# Patient Record
Sex: Female | Born: 1965 | Hispanic: Yes | Marital: Married | Smoking: Never smoker
Health system: Southern US, Community
[De-identification: ages and names within clinical notes are randomized; demographics above are authoritative.]

## PROBLEM LIST (undated history)

## (undated) DIAGNOSIS — M659 Unspecified synovitis and tenosynovitis, unspecified site: Secondary | ICD-10-CM

## (undated) DIAGNOSIS — R1013 Epigastric pain: Secondary | ICD-10-CM

## (undated) DIAGNOSIS — M064 Inflammatory polyarthropathy: Secondary | ICD-10-CM

## (undated) DIAGNOSIS — C439 Malignant melanoma of skin, unspecified: Secondary | ICD-10-CM

## (undated) DIAGNOSIS — R7689 Other specified abnormal immunological findings in serum: Secondary | ICD-10-CM

## (undated) DIAGNOSIS — K219 Gastro-esophageal reflux disease without esophagitis: Secondary | ICD-10-CM

## (undated) DIAGNOSIS — R768 Other specified abnormal immunological findings in serum: Secondary | ICD-10-CM

## (undated) DIAGNOSIS — E785 Hyperlipidemia, unspecified: Secondary | ICD-10-CM

## (undated) DIAGNOSIS — R7982 Elevated C-reactive protein (CRP): Secondary | ICD-10-CM

## (undated) DIAGNOSIS — K2270 Barrett's esophagus without dysplasia: Secondary | ICD-10-CM

## (undated) DIAGNOSIS — M19041 Primary osteoarthritis, right hand: Secondary | ICD-10-CM

## (undated) DIAGNOSIS — K209 Esophagitis, unspecified without bleeding: Secondary | ICD-10-CM

## (undated) DIAGNOSIS — M1990 Unspecified osteoarthritis, unspecified site: Secondary | ICD-10-CM

## (undated) DIAGNOSIS — K449 Diaphragmatic hernia without obstruction or gangrene: Secondary | ICD-10-CM

## (undated) DIAGNOSIS — D649 Anemia, unspecified: Secondary | ICD-10-CM

## (undated) DIAGNOSIS — N809 Endometriosis, unspecified: Secondary | ICD-10-CM

## (undated) DIAGNOSIS — Z8619 Personal history of other infectious and parasitic diseases: Secondary | ICD-10-CM

## (undated) DIAGNOSIS — N8003 Adenomyosis of the uterus: Secondary | ICD-10-CM

## (undated) DIAGNOSIS — N800 Endometriosis of uterus: Secondary | ICD-10-CM

## (undated) DIAGNOSIS — J309 Allergic rhinitis, unspecified: Secondary | ICD-10-CM

## (undated) DIAGNOSIS — D219 Benign neoplasm of connective and other soft tissue, unspecified: Secondary | ICD-10-CM

## (undated) DIAGNOSIS — D271 Benign neoplasm of left ovary: Secondary | ICD-10-CM

## (undated) HISTORY — PX: CERVICAL POLYPECTOMY: SHX88

## (undated) HISTORY — PX: ABLATION: SHX5711

## (undated) HISTORY — PX: TUBAL LIGATION: SHX77

## (undated) HISTORY — PX: TONSILLECTOMY: SUR1361

## (undated) HISTORY — PX: BILATERAL SALPINGOOPHORECTOMY: SHX1223

## (undated) HISTORY — PX: ABDOMINAL HYSTERECTOMY: SHX81

## (undated) HISTORY — PX: ESOPHAGOGASTRODUODENOSCOPY: SHX1529

## (undated) HISTORY — PX: MELANOMA EXCISION: SHX5266

## (undated) HISTORY — PX: COLONOSCOPY: SHX174

---

## 1997-08-14 DIAGNOSIS — C439 Malignant melanoma of skin, unspecified: Secondary | ICD-10-CM

## 1997-08-14 HISTORY — DX: Malignant melanoma of skin, unspecified: C43.9

## 2005-05-29 ENCOUNTER — Ambulatory Visit: Payer: Self-pay | Admitting: Obstetrics and Gynecology

## 2006-07-03 ENCOUNTER — Ambulatory Visit: Payer: Self-pay | Admitting: Obstetrics and Gynecology

## 2007-07-10 ENCOUNTER — Ambulatory Visit: Payer: Self-pay | Admitting: Obstetrics and Gynecology

## 2008-05-14 ENCOUNTER — Ambulatory Visit: Payer: Self-pay | Admitting: Internal Medicine

## 2008-06-14 ENCOUNTER — Ambulatory Visit: Payer: Self-pay | Admitting: Internal Medicine

## 2008-06-19 ENCOUNTER — Ambulatory Visit: Payer: Self-pay | Admitting: Internal Medicine

## 2008-07-14 ENCOUNTER — Ambulatory Visit: Payer: Self-pay | Admitting: Internal Medicine

## 2008-07-17 ENCOUNTER — Ambulatory Visit: Payer: Self-pay | Admitting: Obstetrics and Gynecology

## 2009-06-16 ENCOUNTER — Ambulatory Visit: Payer: Self-pay | Admitting: Obstetrics and Gynecology

## 2009-06-25 ENCOUNTER — Ambulatory Visit: Payer: Self-pay | Admitting: Obstetrics and Gynecology

## 2009-07-20 ENCOUNTER — Ambulatory Visit: Payer: Self-pay | Admitting: Obstetrics and Gynecology

## 2010-07-21 ENCOUNTER — Ambulatory Visit: Payer: Self-pay | Admitting: Obstetrics and Gynecology

## 2011-01-15 ENCOUNTER — Emergency Department: Payer: Self-pay | Admitting: Emergency Medicine

## 2011-02-08 ENCOUNTER — Ambulatory Visit: Payer: Self-pay | Admitting: Internal Medicine

## 2011-02-09 ENCOUNTER — Ambulatory Visit: Payer: Self-pay | Admitting: Obstetrics and Gynecology

## 2011-03-07 ENCOUNTER — Ambulatory Visit: Payer: Self-pay | Admitting: Gastroenterology

## 2011-03-09 LAB — PATHOLOGY REPORT

## 2011-04-03 ENCOUNTER — Ambulatory Visit: Payer: Self-pay | Admitting: Obstetrics and Gynecology

## 2011-04-10 ENCOUNTER — Inpatient Hospital Stay: Payer: Self-pay | Admitting: Obstetrics and Gynecology

## 2011-09-19 ENCOUNTER — Ambulatory Visit: Payer: Self-pay | Admitting: Obstetrics and Gynecology

## 2012-07-02 ENCOUNTER — Ambulatory Visit: Payer: Self-pay | Admitting: Gastroenterology

## 2012-07-03 LAB — PATHOLOGY REPORT

## 2012-09-19 ENCOUNTER — Ambulatory Visit: Payer: Self-pay | Admitting: Obstetrics and Gynecology

## 2013-06-16 ENCOUNTER — Ambulatory Visit: Payer: Self-pay | Admitting: Gastroenterology

## 2013-06-17 LAB — PATHOLOGY REPORT

## 2013-08-17 ENCOUNTER — Ambulatory Visit: Payer: Self-pay | Admitting: Physician Assistant

## 2013-09-22 ENCOUNTER — Ambulatory Visit: Payer: Self-pay | Admitting: Obstetrics and Gynecology

## 2014-09-23 ENCOUNTER — Ambulatory Visit: Payer: Self-pay | Admitting: Obstetrics and Gynecology

## 2015-06-15 ENCOUNTER — Other Ambulatory Visit: Payer: Self-pay | Admitting: Obstetrics and Gynecology

## 2015-06-15 DIAGNOSIS — Z1231 Encounter for screening mammogram for malignant neoplasm of breast: Secondary | ICD-10-CM

## 2015-09-27 ENCOUNTER — Other Ambulatory Visit: Payer: Self-pay | Admitting: Obstetrics and Gynecology

## 2015-09-27 ENCOUNTER — Ambulatory Visit
Admission: RE | Admit: 2015-09-27 | Discharge: 2015-09-27 | Disposition: A | Discharge status: Home / Self Care | Source: Ambulatory Visit | Attending: Obstetrics and Gynecology | Admitting: Obstetrics and Gynecology

## 2015-09-27 DIAGNOSIS — Z1231 Encounter for screening mammogram for malignant neoplasm of breast: Secondary | ICD-10-CM | POA: Insufficient documentation

## 2015-09-27 HISTORY — DX: Malignant melanoma of skin, unspecified: C43.9

## 2015-11-05 ENCOUNTER — Other Ambulatory Visit: Payer: Self-pay | Admitting: Gastroenterology

## 2015-11-05 DIAGNOSIS — R1013 Epigastric pain: Secondary | ICD-10-CM

## 2015-11-11 ENCOUNTER — Other Ambulatory Visit: Payer: Self-pay | Admitting: Gastroenterology

## 2015-11-11 ENCOUNTER — Ambulatory Visit
Admission: RE | Admit: 2015-11-11 | Discharge: 2015-11-11 | Disposition: A | Discharge status: Home / Self Care | Source: Ambulatory Visit | Attending: Gastroenterology | Admitting: Gastroenterology

## 2015-11-11 DIAGNOSIS — K219 Gastro-esophageal reflux disease without esophagitis: Secondary | ICD-10-CM | POA: Diagnosis not present

## 2015-11-11 DIAGNOSIS — R1013 Epigastric pain: Secondary | ICD-10-CM

## 2016-06-16 ENCOUNTER — Other Ambulatory Visit: Payer: Self-pay | Admitting: Obstetrics and Gynecology

## 2016-06-16 DIAGNOSIS — Z1231 Encounter for screening mammogram for malignant neoplasm of breast: Secondary | ICD-10-CM

## 2016-09-28 ENCOUNTER — Ambulatory Visit
Admission: RE | Admit: 2016-09-28 | Discharge: 2016-09-28 | Disposition: A | Discharge status: Home / Self Care | Source: Ambulatory Visit | Attending: Obstetrics and Gynecology | Admitting: Obstetrics and Gynecology

## 2016-09-28 DIAGNOSIS — Z1231 Encounter for screening mammogram for malignant neoplasm of breast: Secondary | ICD-10-CM | POA: Diagnosis present

## 2017-02-12 ENCOUNTER — Other Ambulatory Visit: Payer: Self-pay | Admitting: Gastroenterology

## 2017-02-12 DIAGNOSIS — R768 Other specified abnormal immunological findings in serum: Secondary | ICD-10-CM

## 2017-02-20 ENCOUNTER — Ambulatory Visit
Admission: RE | Admit: 2017-02-20 | Discharge: 2017-02-20 | Disposition: A | Discharge status: Home / Self Care | Source: Ambulatory Visit | Attending: Gastroenterology | Admitting: Gastroenterology

## 2017-02-20 DIAGNOSIS — R1013 Epigastric pain: Secondary | ICD-10-CM | POA: Diagnosis present

## 2017-02-20 DIAGNOSIS — R768 Other specified abnormal immunological findings in serum: Secondary | ICD-10-CM

## 2017-02-21 ENCOUNTER — Other Ambulatory Visit: Payer: Self-pay | Admitting: Gastroenterology

## 2017-02-21 DIAGNOSIS — K769 Liver disease, unspecified: Secondary | ICD-10-CM

## 2017-02-21 DIAGNOSIS — R935 Abnormal findings on diagnostic imaging of other abdominal regions, including retroperitoneum: Secondary | ICD-10-CM

## 2017-02-27 ENCOUNTER — Ambulatory Visit
Admission: RE | Admit: 2017-02-27 | Discharge: 2017-02-27 | Disposition: A | Discharge status: Home / Self Care | Source: Ambulatory Visit | Attending: Gastroenterology | Admitting: Gastroenterology

## 2017-02-27 DIAGNOSIS — K769 Liver disease, unspecified: Secondary | ICD-10-CM | POA: Insufficient documentation

## 2017-02-27 DIAGNOSIS — K760 Fatty (change of) liver, not elsewhere classified: Secondary | ICD-10-CM | POA: Diagnosis not present

## 2017-02-27 DIAGNOSIS — R935 Abnormal findings on diagnostic imaging of other abdominal regions, including retroperitoneum: Secondary | ICD-10-CM | POA: Diagnosis present

## 2017-02-27 MED ORDER — GADOBENATE DIMEGLUMINE 529 MG/ML IV SOLN
15.0000 mL | Freq: Once | INTRAVENOUS | Status: AC | PRN
  Administered 2017-02-27: 15 mL via INTRAVENOUS

## 2017-06-29 ENCOUNTER — Other Ambulatory Visit: Payer: Self-pay | Admitting: Obstetrics and Gynecology

## 2017-06-29 DIAGNOSIS — Z1239 Encounter for other screening for malignant neoplasm of breast: Secondary | ICD-10-CM

## 2017-07-31 ENCOUNTER — Encounter: Payer: Self-pay | Admitting: *Deleted

## 2017-08-01 ENCOUNTER — Ambulatory Visit: Admitting: Registered Nurse

## 2017-08-01 ENCOUNTER — Ambulatory Visit
Admission: RE | Admit: 2017-08-01 | Discharge: 2017-08-01 | Disposition: A | Discharge status: Home / Self Care | Source: Ambulatory Visit | Attending: Internal Medicine | Admitting: Internal Medicine

## 2017-08-01 ENCOUNTER — Encounter: Payer: Self-pay | Admitting: *Deleted

## 2017-08-01 ENCOUNTER — Encounter
Admission: RE | Disposition: A | Discharge status: Home / Self Care | Payer: Self-pay | Source: Ambulatory Visit | Attending: Internal Medicine

## 2017-08-01 DIAGNOSIS — K2270 Barrett's esophagus without dysplasia: Secondary | ICD-10-CM | POA: Diagnosis not present

## 2017-08-01 DIAGNOSIS — Z79899 Other long term (current) drug therapy: Secondary | ICD-10-CM | POA: Diagnosis not present

## 2017-08-01 DIAGNOSIS — E669 Obesity, unspecified: Secondary | ICD-10-CM | POA: Diagnosis not present

## 2017-08-01 DIAGNOSIS — R1013 Epigastric pain: Secondary | ICD-10-CM | POA: Diagnosis present

## 2017-08-01 DIAGNOSIS — Z6831 Body mass index (BMI) 31.0-31.9, adult: Secondary | ICD-10-CM | POA: Diagnosis not present

## 2017-08-01 DIAGNOSIS — K3189 Other diseases of stomach and duodenum: Secondary | ICD-10-CM | POA: Insufficient documentation

## 2017-08-01 DIAGNOSIS — K2950 Unspecified chronic gastritis without bleeding: Secondary | ICD-10-CM | POA: Diagnosis not present

## 2017-08-01 DIAGNOSIS — K210 Gastro-esophageal reflux disease with esophagitis: Secondary | ICD-10-CM | POA: Diagnosis not present

## 2017-08-01 DIAGNOSIS — M1990 Unspecified osteoarthritis, unspecified site: Secondary | ICD-10-CM | POA: Diagnosis not present

## 2017-08-01 HISTORY — DX: Other specified abnormal immunological findings in serum: R76.8

## 2017-08-01 HISTORY — DX: Unspecified osteoarthritis, unspecified site: M19.90

## 2017-08-01 HISTORY — DX: Gastro-esophageal reflux disease without esophagitis: K21.9

## 2017-08-01 HISTORY — DX: Elevated C-reactive protein (CRP): R79.82

## 2017-08-01 HISTORY — DX: Other specified abnormal immunological findings in serum: R76.89

## 2017-08-01 HISTORY — DX: Anemia, unspecified: D64.9

## 2017-08-01 HISTORY — PX: ESOPHAGOGASTRODUODENOSCOPY (EGD) WITH PROPOFOL: SHX5813

## 2017-08-01 SURGERY — ESOPHAGOGASTRODUODENOSCOPY (EGD) WITH PROPOFOL
Anesthesia: General

## 2017-08-01 MED ORDER — FENTANYL CITRATE (PF) 100 MCG/2ML IJ SOLN
INTRAMUSCULAR | Status: AC
  Filled 2017-08-01: qty 2

## 2017-08-01 MED ORDER — MIDAZOLAM HCL 2 MG/2ML IJ SOLN
INTRAMUSCULAR | Status: AC
  Filled 2017-08-01: qty 2

## 2017-08-01 MED ORDER — PROPOFOL 500 MG/50ML IV EMUL
INTRAVENOUS | Status: DC | PRN
  Administered 2017-08-01: 140 ug/kg/min via INTRAVENOUS

## 2017-08-01 MED ORDER — LIDOCAINE HCL (CARDIAC) 20 MG/ML IV SOLN
INTRAVENOUS | Status: DC | PRN
  Administered 2017-08-01: 40 mg via INTRAVENOUS

## 2017-08-01 MED ORDER — PROPOFOL 10 MG/ML IV BOLUS
INTRAVENOUS | Status: DC | PRN
  Administered 2017-08-01: 60 mg via INTRAVENOUS

## 2017-08-01 MED ORDER — FENTANYL CITRATE (PF) 100 MCG/2ML IJ SOLN
INTRAMUSCULAR | Status: DC | PRN
  Administered 2017-08-01: 25 ug via INTRAVENOUS

## 2017-08-01 MED ORDER — SODIUM CHLORIDE 0.9 % IV SOLN
INTRAVENOUS | Status: DC
  Administered 2017-08-01: 10:00:00 via INTRAVENOUS

## 2017-08-01 MED ORDER — MIDAZOLAM HCL 2 MG/2ML IJ SOLN
INTRAMUSCULAR | Status: DC | PRN
  Administered 2017-08-01: 1 mg via INTRAVENOUS

## 2017-08-01 MED ORDER — GLYCOPYRROLATE 0.2 MG/ML IJ SOLN
INTRAMUSCULAR | Status: AC
  Filled 2017-08-01: qty 1

## 2017-08-01 MED ORDER — GLYCOPYRROLATE 0.2 MG/ML IJ SOLN
INTRAMUSCULAR | Status: DC | PRN
  Administered 2017-08-01: 0.2 mg via INTRAVENOUS

## 2017-08-01 NOTE — Transfer of Care (Signed)
Immediate Anesthesia Transfer of Care Note  Patient: Arma Heading  Procedure(s) Performed: ESOPHAGOGASTRODUODENOSCOPY (EGD) WITH PROPOFOL (N/A )  Patient Location: PACU  Anesthesia Type:General  Level of Consciousness: sedated  Airway & Oxygen Therapy: Patient spontaneous breathing connected to nasal cannula   Post-op Assessment: Report given to RN and Post -op Vital signs reviewed and stable  Post vital signs: Reviewed and stable  Last Vitals:  Vitals:   08/01/17 0950 08/01/17 1100  BP: 118/69 105/61  Pulse: 64 79  Resp: 16 10  Temp: (!) 36.3 C (!) 36.2 C  SpO2: 100% 98%    Last Pain:  Vitals:   08/01/17 0950  TempSrc: Tympanic         Complications: No apparent anesthesia complications

## 2017-08-01 NOTE — H&P (Signed)
Outpatient short stay form Pre-procedure 08/01/2017 10:34 AM Teodoro K. Alice Reichert, M.D.  Primary Physician: Juluis Pitch, M.D.  Reason for visit:  Barrett's esophagus, Dyspepsia  History of present illness:  51 y/o female presents for surveillance of known Barrett's esophagus and some mild, recurrent dyspepsia. Paient is maintained on acid suppression with pantoprazole 40mg  po daily.    Current Facility-Administered Medications:  .  0.9 %  sodium chloride infusion, , Intravenous, Continuous, [place], Benay Pike, MD, Last Rate: 20 mL/hr at 08/01/17 1012  Medications Prior to Admission  Medication Sig Dispense Refill Last Dose  . estradiol (CLIMARA - DOSED IN MG/24 HR) 0.1 mg/24hr patch Place 0.1 mg onto the skin once a week.   Past Week at Unknown time  . Influenza Virus Vacc Split PF (FLUARIX IM) Inject into the muscle.   06/14/2017  . Influenza Virus Vaccine Split (AFLURIA IM) Inject into the muscle.   06/14/2017  . meloxicam (MOBIC) 7.5 MG tablet Take 7.5 mg by mouth daily.   07/30/2017  . montelukast (SINGULAIR) 10 MG tablet Take 10 mg by mouth at bedtime.   07/31/2017 at Unknown time  . pantoprazole (PROTONIX) 40 MG tablet Take 40 mg by mouth daily.   07/31/2017 at Unknown time  . ranitidine (ZANTAC) 150 MG tablet Take 150 mg by mouth 2 (two) times daily.   Past Month at Unknown time  . tolterodine (DETROL) 2 MG tablet Take 2 mg by mouth 2 (two) times daily.   07/31/2017 at Unknown time     No Known Allergies   Past Medical History:  Diagnosis Date  . Anemia   . Arthritis   . CRP elevated   . GERD (gastroesophageal reflux disease)   . Hepatitis B antibody positive   . Melanoma ([place]) 1999    Review of systems:      Physical Exam  General appearance: alert, cooperative and appears stated age Resp: clear to auscultation bilaterally Cardio: regular rate and rhythm, S1, S2 normal, no murmur, click, rub or gallop GI: soft, non-tender; bowel sounds normal; no masses,  no  organomegaly Extremities: extremities normal, atraumatic, no cyanosis or edema     Planned procedures: EGD. The patient understands the nature of the planned procedure, indications, risks, alternatives and potential complications including but not limited to bleeding, infection, perforation, damage to internal organs and possible oversedation/side effects from anesthesia. The patient agrees and gives consent to proceed.  Please refer to procedure notes for findings, recommendations and patient disposition/instructions.    Teodoro K. Alice Reichert, M.D. Gastroenterology 08/01/2017  10:34 AM

## 2017-08-01 NOTE — Interval H&P Note (Signed)
History and Physical Interval Note:  08/01/2017 10:38 AM  Tiffany Keller  has presented today for surgery, with the diagnosis of barretts dyspepsia  The various methods of treatment have been discussed with the patient and family. After consideration of risks, benefits and other options for treatment, the patient has consented to  Procedure(s): ESOPHAGOGASTRODUODENOSCOPY (EGD) WITH PROPOFOL (N/A) as a surgical intervention .  The patient's history has been reviewed, patient examined, no change in status, stable for surgery.  I have reviewed the patient's chart and labs.  Questions were answered to the patient's satisfaction.     [place], [place]

## 2017-08-01 NOTE — Anesthesia Postprocedure Evaluation (Signed)
Anesthesia Post Note  Patient: Tiffany Keller  Procedure(s) Performed: ESOPHAGOGASTRODUODENOSCOPY (EGD) WITH PROPOFOL (N/A )  Patient location during evaluation: PACU Anesthesia Type: General Level of consciousness: awake Pain management: pain level controlled Vital Signs Assessment: post-procedure vital signs reviewed and stable Respiratory status: spontaneous breathing Cardiovascular status: stable Anesthetic complications: no     Last Vitals:  Vitals:   08/01/17 1129 08/01/17 1139  BP: 103/61 115/72  Pulse: 73 73  Resp: 10 13  Temp:    SpO2: 100% 100%    Last Pain:  Vitals:   08/01/17 1139  TempSrc:   PainSc: 0-No pain                 VAN STAVEREN,Emari Hreha

## 2017-08-01 NOTE — Anesthesia Preprocedure Evaluation (Signed)
Anesthesia Evaluation  Patient identified by MRN, date of birth, ID band Patient awake    Reviewed: Allergy & Precautions, NPO status , Patient's Chart, lab work & pertinent test results  Airway Mallampati: II       Dental  (+) Teeth Intact   Pulmonary neg pulmonary ROS,    breath sounds clear to auscultation       Cardiovascular Exercise Tolerance: Good  Rhythm:Regular     Neuro/Psych negative neurological ROS     GI/Hepatic Neg liver ROS, GERD  Medicated,  Endo/Other  negative endocrine ROS  Renal/GU negative Renal ROS     Musculoskeletal   Abdominal (+) + obese,   Peds  Hematology  (+) anemia ,   Anesthesia Other Findings   Reproductive/Obstetrics                             Anesthesia Physical Anesthesia Plan  ASA: II  Anesthesia Plan: General   Post-op Pain Management:    Induction: Intravenous  PONV Risk Score and Plan: Ondansetron  Airway Management Planned: Natural Airway and Nasal Cannula  Additional Equipment:   Intra-op Plan:   Post-operative Plan:   Informed Consent: I have reviewed the patients History and Physical, chart, labs and discussed the procedure including the risks, benefits and alternatives for the proposed anesthesia with the patient or authorized representative who has indicated his/her understanding and acceptance.     Plan Discussed with: CRNA  Anesthesia Plan Comments:         Anesthesia Quick Evaluation

## 2017-08-01 NOTE — Anesthesia Post-op Follow-up Note (Signed)
Anesthesia QCDR form completed.        

## 2017-08-01 NOTE — Op Note (Signed)
[facility] Gastroenterology Patient Name: Tiffany Keller Procedure Date: 08/01/2017 10:31 AM MRN: [id number] Account #: [id number] Date of Birth: [birth year] Admit Type: Outpatient Age: 51 Room: [facility] ENDO ROOM 4 Gender: Female Note Status: Finalized Procedure:            Upper GI endoscopy Indications:          Surveillance for malignancy due to personal history of                        Barrett's esophagus, Epigastric abdominal pain Providers:            Benay Pike. Alice Reichert MD, MD Referring MD:         Youlanda Roys. Lovie Macadamia, MD (Referring MD) Medicines:            Propofol per Anesthesia Complications:        No immediate complications. Procedure:            Pre-Anesthesia Assessment:                       - The risks and benefits of the procedure and the                        sedation options and risks were discussed with the                        patient. All questions were answered and informed                        consent was obtained.                       - Patient identification and proposed procedure were                        verified prior to the procedure by the nurse. The                        procedure was verified in the procedure room.                       - ASA Grade Assessment: II - A patient with mild                        systemic disease.                       - After reviewing the risks and benefits, the patient                        was deemed in satisfactory condition to undergo the                        procedure.                       After obtaining informed consent, the endoscope was                        passed under direct vision. Throughout the procedure,  the patient's blood pressure, pulse, and oxygen                        saturations were monitored continuously. The Endoscope                        was introduced through the mouth, and advanced to the                        third part of duodenum.  The upper GI endoscopy was                        accomplished without difficulty. The patient tolerated                        the procedure well. Findings:      The esophagus and gastroesophageal junction were examined with white       light. There were esophageal mucosal changes secondary to established       short-segment Barrett's disease. These changes involved the mucosa along       an irregular Z-line (30 cm from the incisors). Two tongues of       salmon-colored mucosa were present from 30 to 32 cm. The maximum       longitudinal extent of these esophageal mucosal changes was 1 cm in       length. Mucosa was biopsied with a cold forceps for histology in 4       quadrants at intervals of 1 cm. One specimen bottle was sent to       pathology.      The entire examined stomach was normal. Biopsies were taken with a cold       forceps for Helicobacter pylori testing.      Localized mildly erythematous mucosa without active bleeding and with no       stigmata of bleeding was found in the duodenal bulb.      The exam was otherwise without abnormality. Impression:           - Esophageal mucosal changes secondary to established                        short-segment Barrett's disease. Biopsied.                       - Normal stomach. Biopsied.                       - Erythematous duodenopathy.                       - The examination was otherwise normal. Recommendation:       - Await pathology results.                       - Repeat upper endoscopy for surveillance based on                        pathology results.                       - Return to GI office as previously scheduled.                       -  Patient has a contact number available for                        emergencies. The signs and symptoms of potential                        delayed complications were discussed with the patient.                        Return to normal activities tomorrow. Written discharge                         instructions were provided to the patient.                       - Resume previous diet.                       - Continue present medications. Procedure Code(s):    --- Professional ---                       [phone], Esophagogastroduodenoscopy, flexible, transoral;                        with biopsy, single or multiple Diagnosis Code(s):    --- Professional ---                       R10.13, Epigastric pain                       K31.89, Other diseases of stomach and duodenum                       K22.70, Barrett's esophagus without dysplasia CPT copyright 2016 American Medical Association. All rights reserved. The codes documented in this report are preliminary and upon coder review may  be revised to meet current compliance requirements. Efrain Sella MD, MD 08/01/2017 11:00:57 AM This report has been signed electronically. Number of Addenda: 0 Note Initiated On: 08/01/2017 10:31 AM      [facility]

## 2017-08-02 ENCOUNTER — Encounter: Payer: Self-pay | Admitting: Internal Medicine

## 2017-08-03 LAB — SURGICAL PATHOLOGY

## 2017-10-02 ENCOUNTER — Ambulatory Visit
Admission: RE | Admit: 2017-10-02 | Discharge: 2017-10-02 | Disposition: A | Discharge status: Home / Self Care | Source: Ambulatory Visit | Attending: Obstetrics and Gynecology | Admitting: Obstetrics and Gynecology

## 2017-10-02 DIAGNOSIS — Z1239 Encounter for other screening for malignant neoplasm of breast: Secondary | ICD-10-CM

## 2017-10-02 DIAGNOSIS — Z1231 Encounter for screening mammogram for malignant neoplasm of breast: Secondary | ICD-10-CM | POA: Insufficient documentation

## 2018-07-18 ENCOUNTER — Other Ambulatory Visit: Payer: Self-pay | Admitting: Obstetrics and Gynecology

## 2018-07-18 DIAGNOSIS — Z1231 Encounter for screening mammogram for malignant neoplasm of breast: Secondary | ICD-10-CM

## 2018-10-04 ENCOUNTER — Ambulatory Visit
Admission: RE | Admit: 2018-10-04 | Discharge: 2018-10-04 | Disposition: A | Discharge status: Home / Self Care | Source: Ambulatory Visit | Attending: Obstetrics and Gynecology | Admitting: Obstetrics and Gynecology

## 2018-10-04 DIAGNOSIS — Z1231 Encounter for screening mammogram for malignant neoplasm of breast: Secondary | ICD-10-CM | POA: Diagnosis present

## 2019-07-24 ENCOUNTER — Other Ambulatory Visit: Payer: Self-pay | Admitting: Obstetrics and Gynecology

## 2019-07-24 DIAGNOSIS — Z1231 Encounter for screening mammogram for malignant neoplasm of breast: Secondary | ICD-10-CM

## 2019-10-07 ENCOUNTER — Ambulatory Visit
Admission: RE | Admit: 2019-10-07 | Discharge: 2019-10-07 | Disposition: A | Discharge status: Home / Self Care | Source: Ambulatory Visit | Attending: Obstetrics and Gynecology | Admitting: Obstetrics and Gynecology

## 2019-10-07 DIAGNOSIS — Z1231 Encounter for screening mammogram for malignant neoplasm of breast: Secondary | ICD-10-CM | POA: Diagnosis not present

## 2020-06-29 ENCOUNTER — Encounter: Payer: Self-pay | Admitting: Licensed Clinical Social Worker

## 2020-07-22 ENCOUNTER — Other Ambulatory Visit: Payer: Self-pay

## 2020-07-22 ENCOUNTER — Other Ambulatory Visit
Admission: RE | Admit: 2020-07-22 | Discharge: 2020-07-22 | Disposition: A | Discharge status: Home / Self Care | Source: Ambulatory Visit | Attending: Gastroenterology | Admitting: Gastroenterology

## 2020-07-22 DIAGNOSIS — Z01812 Encounter for preprocedural laboratory examination: Secondary | ICD-10-CM | POA: Insufficient documentation

## 2020-07-22 DIAGNOSIS — Z20822 Contact with and (suspected) exposure to covid-19: Secondary | ICD-10-CM | POA: Diagnosis not present

## 2020-07-23 ENCOUNTER — Encounter: Payer: Self-pay | Admitting: *Deleted

## 2020-07-23 LAB — SARS CORONAVIRUS 2 (TAT 6-24 HRS): SARS Coronavirus 2: NEGATIVE

## 2020-07-26 ENCOUNTER — Ambulatory Visit: Admitting: Anesthesiology

## 2020-07-26 ENCOUNTER — Ambulatory Visit
Admission: RE | Admit: 2020-07-26 | Discharge: 2020-07-26 | Disposition: A | Discharge status: Home / Self Care | Attending: Gastroenterology | Admitting: Gastroenterology

## 2020-07-26 ENCOUNTER — Encounter
Admission: RE | Disposition: A | Discharge status: Home / Self Care | Payer: Self-pay | Source: Home / Self Care | Attending: Gastroenterology

## 2020-07-26 ENCOUNTER — Other Ambulatory Visit: Payer: Self-pay

## 2020-07-26 DIAGNOSIS — Z1211 Encounter for screening for malignant neoplasm of colon: Secondary | ICD-10-CM | POA: Insufficient documentation

## 2020-07-26 DIAGNOSIS — Z79899 Other long term (current) drug therapy: Secondary | ICD-10-CM | POA: Diagnosis not present

## 2020-07-26 DIAGNOSIS — Z791 Long term (current) use of non-steroidal anti-inflammatories (NSAID): Secondary | ICD-10-CM | POA: Insufficient documentation

## 2020-07-26 DIAGNOSIS — K219 Gastro-esophageal reflux disease without esophagitis: Secondary | ICD-10-CM | POA: Insufficient documentation

## 2020-07-26 DIAGNOSIS — Z85820 Personal history of malignant melanoma of skin: Secondary | ICD-10-CM | POA: Insufficient documentation

## 2020-07-26 DIAGNOSIS — K449 Diaphragmatic hernia without obstruction or gangrene: Secondary | ICD-10-CM | POA: Diagnosis not present

## 2020-07-26 HISTORY — DX: Diaphragmatic hernia without obstruction or gangrene: K44.9

## 2020-07-26 HISTORY — DX: Benign neoplasm of left ovary: D27.1

## 2020-07-26 HISTORY — DX: Barrett's esophagus without dysplasia: K22.70

## 2020-07-26 HISTORY — DX: Allergic rhinitis, unspecified: J30.9

## 2020-07-26 HISTORY — PX: COLONOSCOPY: SHX5424

## 2020-07-26 HISTORY — DX: Adenomyosis of the uterus: N80.03

## 2020-07-26 HISTORY — PX: ESOPHAGOGASTRODUODENOSCOPY (EGD) WITH PROPOFOL: SHX5813

## 2020-07-26 HISTORY — DX: Endometriosis, unspecified: N80.9

## 2020-07-26 HISTORY — DX: Personal history of other infectious and parasitic diseases: Z86.19

## 2020-07-26 HISTORY — DX: Hyperlipidemia, unspecified: E78.5

## 2020-07-26 HISTORY — DX: Benign neoplasm of connective and other soft tissue, unspecified: D21.9

## 2020-07-26 HISTORY — DX: Esophagitis, unspecified without bleeding: K20.90

## 2020-07-26 HISTORY — DX: Endometriosis of uterus: N80.0

## 2020-07-26 SURGERY — COLONOSCOPY
Anesthesia: General

## 2020-07-26 MED ORDER — PROPOFOL 500 MG/50ML IV EMUL
INTRAVENOUS | Status: DC | PRN
  Administered 2020-07-26: 180 ug/kg/min via INTRAVENOUS

## 2020-07-26 MED ORDER — PROPOFOL 500 MG/50ML IV EMUL
INTRAVENOUS | Status: AC
  Filled 2020-07-26: qty 50

## 2020-07-26 MED ORDER — EPHEDRINE SULFATE 50 MG/ML IJ SOLN
INTRAMUSCULAR | Status: DC | PRN
  Administered 2020-07-26: 10 mg via INTRAVENOUS

## 2020-07-26 MED ORDER — LIDOCAINE HCL (PF) 2 % IJ SOLN
INTRAMUSCULAR | Status: AC
  Filled 2020-07-26: qty 5

## 2020-07-26 MED ORDER — PROPOFOL 10 MG/ML IV BOLUS
INTRAVENOUS | Status: DC | PRN
  Administered 2020-07-26: 50 mg via INTRAVENOUS
  Administered 2020-07-26 (×2): 20 mg via INTRAVENOUS

## 2020-07-26 MED ORDER — SODIUM CHLORIDE 0.9 % IV SOLN
INTRAVENOUS | Status: DC
  Administered 2020-07-26: 20 mL/h via INTRAVENOUS

## 2020-07-26 MED ORDER — PHENYLEPHRINE HCL (PRESSORS) 10 MG/ML IV SOLN
INTRAVENOUS | Status: DC | PRN
  Administered 2020-07-26 (×2): 100 ug via INTRAVENOUS

## 2020-07-26 MED ORDER — PROPOFOL 10 MG/ML IV BOLUS
INTRAVENOUS | Status: AC
  Filled 2020-07-26: qty 20

## 2020-07-26 NOTE — Anesthesia Postprocedure Evaluation (Signed)
Anesthesia Post Note  Patient: Arma Heading  Procedure(s) Performed: COLONOSCOPY (N/A ) ESOPHAGOGASTRODUODENOSCOPY (EGD) WITH PROPOFOL (N/A )  Patient location during evaluation: Endoscopy Anesthesia Type: General Level of consciousness: awake and alert Pain management: pain level controlled Vital Signs Assessment: post-procedure vital signs reviewed and stable Respiratory status: spontaneous breathing, nonlabored ventilation, respiratory function stable and patient connected to nasal cannula oxygen Cardiovascular status: blood pressure returned to baseline and stable Postop Assessment: no apparent nausea or vomiting Anesthetic complications: no   No complications documented.   Last Vitals:  Vitals:   07/26/20 1127 07/26/20 1147  BP: 132/79 127/68  Pulse: 72 66  Resp: 11 12  Temp: (!) 36.4 C   SpO2: 99% 100%    Last Pain:  Vitals:   07/26/20 1147  TempSrc:   PainSc: 0-No pain                 Arita Miss

## 2020-07-26 NOTE — Op Note (Signed)
[facility] Gastroenterology Patient Name: Tiffany Keller Procedure Date: 07/26/2020 10:30 AM MRN: [id number] Account #: [id number] Date of Birth: [birth year] Admit Type: Outpatient Age: 54 Room: [facility] ENDO ROOM 3 Gender: Female Note Status: Finalized Procedure:             Colonoscopy Indications:           Screening for colorectal malignant neoplasm Providers:             Andrey Farmer MD, MD Referring MD:          Youlanda Roys. Lovie Macadamia, MD (Referring MD) Medicines:             Monitored Anesthesia Care Complications:         No immediate complications. Estimated blood loss:                         Minimal. Procedure:             Pre-Anesthesia Assessment:                        - Prior to the procedure, a History and Physical was                         performed, and patient medications and allergies were                         reviewed. The patient is competent. The risks and                         benefits of the procedure and the sedation options and                         risks were discussed with the patient. All questions                         were answered and informed consent was obtained.                         Patient identification and proposed procedure were                         verified by the physician, the nurse, the anesthetist                         and the technician in the endoscopy suite. Mental                         Status Examination: alert and oriented. Airway                         Examination: normal oropharyngeal airway and neck                         mobility. Respiratory Examination: clear to                         auscultation. CV Examination: normal. Prophylactic  Antibiotics: The patient does not require prophylactic                         antibiotics. Prior Anticoagulants: The patient has                         taken no previous anticoagulant or antiplatelet                         agents.  ASA Grade Assessment: II - A patient with mild                         systemic disease. After reviewing the risks and                         benefits, the patient was deemed in satisfactory                         condition to undergo the procedure. The anesthesia                         plan was to use monitored anesthesia care (MAC).                         Immediately prior to administration of medications,                         the patient was re-assessed for adequacy to receive                         sedatives. The heart rate, respiratory rate, oxygen                         saturations, blood pressure, adequacy of pulmonary                         ventilation, and response to care were monitored                         throughout the procedure. The physical status of the                         patient was re-assessed after the procedure.                        After obtaining informed consent, the colonoscope was                         passed under direct vision. Throughout the procedure,                         the patient's blood pressure, pulse, and oxygen                         saturations were monitored continuously. The                         Colonoscope was introduced through the anus and  advanced to the the cecum, identified by appendiceal                         orifice and ileocecal valve. The colonoscopy was                         performed without difficulty. The patient tolerated                         the procedure well. The quality of the bowel                         preparation was good. Findings:      The perianal and digital rectal examinations were normal.      A polyp was found in the descending colon. The polyp was sessile. The       polyp was removed with a jumbo cold forceps. Resection and retrieval       were complete. Estimated blood loss was minimal.      A 4 mm polyp was found in the sigmoid colon. The polyp was sessile.  The       polyp was removed with a cold snare. Resection and retrieval were       complete. Estimated blood loss was minimal.      The exam was otherwise without abnormality on direct and retroflexion       views. Impression:            - One polyp in the descending colon, removed with a                         jumbo cold forceps. Resected and retrieved.                        - One 4 mm polyp in the sigmoid colon, removed with a                         cold snare. Resected and retrieved.                        - The examination was otherwise normal on direct and                         retroflexion views. Recommendation:        - Discharge patient to home.                        - Resume previous diet.                        - Continue present medications.                        - Await pathology results.                        - Repeat colonoscopy for surveillance based on                         pathology results.                        -  Return to referring physician as previously                         scheduled. Procedure Code(s):     --- Professional ---                        [phone], Colonoscopy, flexible; with removal of                         tumor(s), polyp(s), or other lesion(s) by snare                         technique                        [id number], 57, Colonoscopy, flexible; with biopsy, single                         or multiple Diagnosis Code(s):     --- Professional ---                        Z12.11, Encounter for screening for malignant neoplasm                         of colon                        K63.5, Polyp of colon CPT copyright 2019 American Medical Association. All rights reserved. The codes documented in this report are preliminary and upon coder review may  be revised to meet current compliance requirements. Andrey Farmer, MD Andrey Farmer MD, MD 07/26/2020 11:27:13 AM Number of Addenda: 0 Note Initiated On: 07/26/2020 10:30 AM Scope Withdrawal  Time: 0 hours 15 minutes 12 seconds  Total Procedure Duration: 0 hours 25 minutes 41 seconds  Estimated Blood Loss:  Estimated blood loss was minimal.      [facility]

## 2020-07-26 NOTE — H&P (Signed)
Outpatient short stay form Pre-procedure 07/26/2020 10:19 AM Raylene Miyamoto MD, MPH  Primary Physician: [facility]  Reason for visit:  Barrett's Surveillance, Screening colon  History of present illness:   54 y/o lady with history of BE's without dysplasia here for BE's surveillance and colon cancer screening. History of hysterectomy. No blood thinners. No family history of GI malignancies.    Current Facility-Administered Medications:  .  0.9 %  sodium chloride infusion, , Intravenous, Continuous, Shelanda Duvall, Hilton Cork, MD, Last Rate: 20 mL/hr at 07/26/20 1014, 20 mL/hr at 07/26/20 1014  Medications Prior to Admission  Medication Sig Dispense Refill Last Dose  . estradiol (CLIMARA - DOSED IN MG/24 HR) 0.1 mg/24hr patch Place 0.1 mg onto the skin once a week.   Past Week at Unknown time  . Influenza Virus Vacc Split PF (FLUARIX IM) Inject into the muscle.   Past Week at Unknown time  . Influenza Virus Vaccine Split (AFLURIA IM) Inject into the muscle.   Past Week at Unknown time  . meloxicam (MOBIC) 7.5 MG tablet Take 7.5 mg by mouth daily.   Past Week at Unknown time  . montelukast (SINGULAIR) 10 MG tablet Take 10 mg by mouth at bedtime.   Past Week at Unknown time  . pantoprazole (PROTONIX) 40 MG tablet Take 40 mg by mouth daily.   Past Week at Unknown time  . ranitidine (ZANTAC) 150 MG tablet Take 150 mg by mouth 2 (two) times daily.   Past Week at Unknown time  . tolterodine (DETROL) 2 MG tablet Take 2 mg by mouth 2 (two) times daily.   Past Week at Unknown time     No Known Allergies   Past Medical History:  Diagnosis Date  . Adenomyosis   . Allergic rhinitis   . Anemia   . Arthritis   . Barrett's esophagus with esophagitis   . CRP elevated   . Dermoid cyst of left ovary   . Endometriosis   . GERD (gastroesophageal reflux disease)   . Hepatitis B antibody positive   . Hiatal hernia   . History of chickenpox   . Hyperlipidemia   . Leiomyoma   .  Melanoma ([place]) 1999    Review of systems:  Otherwise negative.    Physical Exam  Gen: Alert, oriented. Appears stated age.  HEENT: PERRLA. Lungs: No respiratory distress CV: RRR Abd: soft, benign, no masses Ext: No edema    Planned procedures: Proceed with EGD/colonoscopy. The patient understands the nature of the planned procedure, indications, risks, alternatives and potential complications including but not limited to bleeding, infection, perforation, damage to internal organs and possible oversedation/side effects from anesthesia. The patient agrees and gives consent to proceed.  Please refer to procedure notes for findings, recommendations and patient disposition/instructions.     Raylene Miyamoto MD, MPH Gastroenterology 07/26/2020  10:19 AM

## 2020-07-26 NOTE — Anesthesia Preprocedure Evaluation (Signed)
Anesthesia Evaluation  Patient identified by MRN, date of birth, ID band Patient awake    Reviewed: Allergy & Precautions, NPO status , Patient's Chart, lab work & pertinent test results  History of Anesthesia Complications Negative for: history of anesthetic complications  Airway Mallampati: II  TM Distance: >3 FB Neck ROM: Full    Dental  (+) Teeth Intact   Pulmonary neg pulmonary ROS, neg sleep apnea, neg COPD, Patient abstained from smoking.Not current smoker,    breath sounds clear to auscultation       Cardiovascular Exercise Tolerance: Good METS(-) hypertension(-) CAD and (-) Past MI negative cardio ROS  (-) dysrhythmias  Rhythm:Regular Rate:Normal - Systolic murmurs    Neuro/Psych negative neurological ROS  negative psych ROS   GI/Hepatic Neg liver ROS, GERD  Medicated,  Endo/Other  negative endocrine ROSneg diabetes  Renal/GU negative Renal ROS     Musculoskeletal   Abdominal (+) + obese,   Peds  Hematology  (+) anemia ,   Anesthesia Other Findings Past Medical History: No date: Adenomyosis No date: Allergic rhinitis No date: Anemia No date: Arthritis No date: Barrett's esophagus with esophagitis No date: CRP elevated No date: Dermoid cyst of left ovary No date: Endometriosis No date: GERD (gastroesophageal reflux disease) No date: Hepatitis B antibody positive No date: Hiatal hernia No date: History of chickenpox No date: Hyperlipidemia No date: Leiomyoma 1999: Melanoma ([place])  Reproductive/Obstetrics                             Anesthesia Physical  Anesthesia Plan  ASA: II  Anesthesia Plan: General   Post-op Pain Management:    Induction: Intravenous  PONV Risk Score and Plan: 3 and Ondansetron, TIVA and Propofol infusion  Airway Management Planned: Natural Airway and Nasal Cannula  Additional Equipment: None  Intra-op Plan:   Post-operative Plan:    Informed Consent: I have reviewed the patients History and Physical, chart, labs and discussed the procedure including the risks, benefits and alternatives for the proposed anesthesia with the patient or authorized representative who has indicated his/her understanding and acceptance.     Dental advisory given  Plan Discussed with: CRNA  Anesthesia Plan Comments: (Discussed risks of anesthesia with patient, including possibility of difficulty with spontaneous ventilation under anesthesia necessitating airway intervention, PONV, and rare risks such as cardiac or respiratory or neurological events. Patient understands.)        Anesthesia Quick Evaluation

## 2020-07-26 NOTE — Op Note (Signed)
[facility] Gastroenterology Patient Name: Tiffany Keller Procedure Date: 07/26/2020 10:31 AM MRN: [id number] Account #: [id number] Date of Birth: 05/25/1966 Admit Type: Outpatient Age: 54 Room: [facility] ENDO ROOM 3 Gender: Female Note Status: Finalized Procedure:             Upper GI endoscopy Indications:           Gastro-esophageal reflux disease, Suspected Barrett's                         esophagus Providers:             Andrey Farmer MD, MD Referring MD:          Youlanda Roys. Lovie Macadamia, MD (Referring MD) Medicines:             Monitored Anesthesia Care Complications:         No immediate complications. Estimated blood loss:                         Minimal. Procedure:             Pre-Anesthesia Assessment:                        - Prior to the procedure, a History and Physical was                         performed, and patient medications and allergies were                         reviewed. The patient is competent. The risks and                         benefits of the procedure and the sedation options and                         risks were discussed with the patient. All questions                         were answered and informed consent was obtained.                         Patient identification and proposed procedure were                         verified by the physician, the nurse, the anesthetist                         and the technician in the endoscopy suite. Mental                         Status Examination: alert and oriented. Airway                         Examination: normal oropharyngeal airway and neck                         mobility. Respiratory Examination: clear to                         auscultation.  CV Examination: normal. Prophylactic                         Antibiotics: The patient does not require prophylactic                         antibiotics. Prior Anticoagulants: The patient has                         taken no previous  anticoagulant or antiplatelet                         agents. ASA Grade Assessment: II - A patient with mild                         systemic disease. After reviewing the risks and                         benefits, the patient was deemed in satisfactory                         condition to undergo the procedure. The anesthesia                         plan was to use monitored anesthesia care (MAC).                         Immediately prior to administration of medications,                         the patient was re-assessed for adequacy to receive                         sedatives. The heart rate, respiratory rate, oxygen                         saturations, blood pressure, adequacy of pulmonary                         ventilation, and response to care were monitored                         throughout the procedure. The physical status of the                         patient was re-assessed after the procedure.                        After obtaining informed consent, the endoscope was                         passed under direct vision. Throughout the procedure,                         the patient's blood pressure, pulse, and oxygen                         saturations were monitored continuously. The Endoscope  was introduced through the mouth, and advanced to the                         second part of duodenum. The upper GI endoscopy was                         accomplished without difficulty. The patient tolerated                         the procedure well. Findings:      There were esophageal mucosal changes suspicious for short-segment       Barrett's esophagus present in the lower third of the esophagus. The       maximum longitudinal extent of these mucosal changes was 1 cm in length.       Mucosa was biopsied with a cold forceps for histology in a targeted       manner in the lower third of the esophagus. One specimen bottle was sent       to pathology.  Estimated blood loss was minimal.      A small hiatal hernia was present.      The entire examined stomach was normal.      The examined duodenum was normal. Impression:            - Esophageal mucosal changes suspicious for                         short-segment Barrett's esophagus. Biopsied.                        - Small hiatal hernia.                        - Normal stomach.                        - Normal examined duodenum. Recommendation:        - Discharge patient to home.                        - Resume previous diet.                        - Continue present medications.                        - Await pathology results.                        - Return to referring physician as previously                         scheduled. Procedure Code(s):     --- Professional ---                        [phone], Esophagogastroduodenoscopy, flexible,                         transoral; with biopsy, single or multiple Diagnosis Code(s):     --- Professional ---  K22.8, Other specified diseases of esophagus                        K44.9, Diaphragmatic hernia without obstruction or                         gangrene                        K21.9, Gastro-esophageal reflux disease without                         esophagitis CPT copyright 2019 American Medical Association. All rights reserved. The codes documented in this report are preliminary and upon coder review may  be revised to meet current compliance requirements. Andrey Farmer, MD Andrey Farmer MD, MD 07/26/2020 11:23:16 AM Number of Addenda: 0 Note Initiated On: 07/26/2020 10:31 AM Estimated Blood Loss:  Estimated blood loss was minimal.      [facility]

## 2020-07-26 NOTE — Transfer of Care (Signed)
Immediate Anesthesia Transfer of Care Note  Patient: Arma Heading  Procedure(s) Performed: COLONOSCOPY (N/A ) ESOPHAGOGASTRODUODENOSCOPY (EGD) WITH PROPOFOL (N/A )  Patient Location: PACU  Anesthesia Type:General  Level of Consciousness: awake, alert  and oriented  Airway & Oxygen Therapy: Patient Spontanous Breathing  Post-op Assessment: Report given to RN and Post -op Vital signs reviewed and stable  Post vital signs: Reviewed and stable  Last Vitals:  Vitals Value Taken Time  BP 132/79 07/26/20 1127  Temp 36.4 C 07/26/20 1127  Pulse 81 07/26/20 1130  Resp 14 07/26/20 1130  SpO2 98 % 07/26/20 1130  Vitals shown include unvalidated device data.  Last Pain:  Vitals:   07/26/20 1127  TempSrc: Temporal  PainSc: 0-No pain         Complications: No complications documented.

## 2020-07-26 NOTE — Interval H&P Note (Signed)
History and Physical Interval Note:  07/26/2020 10:22 AM  Tiffany Keller  has presented today for surgery, with the diagnosis of BARRETT'S ESOPHAGUS W/O DYSPEPSIA SCREENING.  The various methods of treatment have been discussed with the patient and family. After consideration of risks, benefits and other options for treatment, the patient has consented to  Procedure(s): COLONOSCOPY (N/A) ESOPHAGOGASTRODUODENOSCOPY (EGD) WITH PROPOFOL (N/A) as a surgical intervention.  The patient's history has been reviewed, patient examined, no change in status, stable for surgery.  I have reviewed the patient's chart and labs.  Questions were answered to the patient's satisfaction.     Lesly Rubenstein  Ok to proceed with EGD/Colonoscopy

## 2020-07-27 ENCOUNTER — Encounter: Payer: Self-pay | Admitting: Gastroenterology

## 2020-07-27 LAB — SURGICAL PATHOLOGY

## 2020-07-29 ENCOUNTER — Other Ambulatory Visit: Payer: Self-pay | Admitting: Obstetrics and Gynecology

## 2020-07-29 DIAGNOSIS — Z1231 Encounter for screening mammogram for malignant neoplasm of breast: Secondary | ICD-10-CM

## 2020-10-07 ENCOUNTER — Other Ambulatory Visit: Payer: Self-pay

## 2020-10-07 ENCOUNTER — Ambulatory Visit
Admission: RE | Admit: 2020-10-07 | Discharge: 2020-10-07 | Disposition: A | Discharge status: Home / Self Care | Source: Ambulatory Visit | Attending: Obstetrics and Gynecology | Admitting: Obstetrics and Gynecology

## 2020-10-07 DIAGNOSIS — Z1231 Encounter for screening mammogram for malignant neoplasm of breast: Secondary | ICD-10-CM | POA: Diagnosis present

## 2020-10-18 ENCOUNTER — Other Ambulatory Visit: Payer: Self-pay | Admitting: Obstetrics and Gynecology

## 2020-10-18 DIAGNOSIS — R928 Other abnormal and inconclusive findings on diagnostic imaging of breast: Secondary | ICD-10-CM

## 2020-10-18 DIAGNOSIS — N6320 Unspecified lump in the left breast, unspecified quadrant: Secondary | ICD-10-CM

## 2020-10-22 ENCOUNTER — Ambulatory Visit
Admission: RE | Admit: 2020-10-22 | Discharge: 2020-10-22 | Disposition: A | Discharge status: Home / Self Care | Source: Ambulatory Visit | Attending: Obstetrics and Gynecology | Admitting: Obstetrics and Gynecology

## 2020-10-22 ENCOUNTER — Other Ambulatory Visit: Payer: Self-pay

## 2020-10-22 DIAGNOSIS — N6320 Unspecified lump in the left breast, unspecified quadrant: Secondary | ICD-10-CM | POA: Diagnosis present

## 2020-10-22 DIAGNOSIS — R928 Other abnormal and inconclusive findings on diagnostic imaging of breast: Secondary | ICD-10-CM | POA: Diagnosis present

## 2020-11-10 ENCOUNTER — Encounter: Payer: Self-pay | Admitting: Physical Therapy

## 2020-11-10 ENCOUNTER — Ambulatory Visit: Attending: Gastroenterology | Admitting: Physical Therapy

## 2020-11-10 ENCOUNTER — Other Ambulatory Visit: Payer: Self-pay

## 2020-11-10 DIAGNOSIS — M6281 Muscle weakness (generalized): Secondary | ICD-10-CM | POA: Insufficient documentation

## 2020-11-10 DIAGNOSIS — R278 Other lack of coordination: Secondary | ICD-10-CM | POA: Insufficient documentation

## 2020-11-10 NOTE — Therapy (Signed)
[facility] [address] [place], [place], [address] Phone: [phone]   Fax:  [phone]  Physical Therapy Evaluation  Patient Details  Name: [name] MRN: [id number] Date of Birth: [birth year] Referring Provider (PT): Jacqulyn Liner, [place]   Encounter Date: 11/10/2020   PT End of Session - 11/11/20 1011    Visit Number 1    Number of Visits 12    Date for PT Re-Evaluation 02/03/21    PT Start Time 6834    PT Stop Time 1355    PT Time Calculation (min) 40 min    Activity Tolerance Patient tolerated treatment well    Behavior During Therapy [facility] for tasks assessed/performed           Past Medical History:  Diagnosis Date  . Adenomyosis   . Allergic rhinitis   . Anemia   . Arthritis   . Barrett's esophagus with esophagitis   . CRP elevated   . Dermoid cyst of left ovary   . Endometriosis   . GERD (gastroesophageal reflux disease)   . Hepatitis B antibody positive   . Hiatal hernia   . History of chickenpox   . Hyperlipidemia   . Leiomyoma   . Melanoma ([place]) 1999    Past Surgical History:  Procedure Laterality Date  . ABDOMINAL HYSTERECTOMY    . ABLATION    . BILATERAL SALPINGOOPHORECTOMY    . CERVICAL POLYPECTOMY    . COLONOSCOPY    . COLONOSCOPY N/A 07/26/2020   Procedure: COLONOSCOPY;  Surgeon: Lesly Rubenstein, MD;  Location: [facility] ENDOSCOPY;  Service: Endoscopy;  Laterality: N/A;  . ESOPHAGOGASTRODUODENOSCOPY    . ESOPHAGOGASTRODUODENOSCOPY (EGD) WITH PROPOFOL N/A 08/01/2017   Procedure: ESOPHAGOGASTRODUODENOSCOPY (EGD) WITH PROPOFOL;  Surgeon: Toledo, Benay Pike, MD;  Location: [facility] ENDOSCOPY;  Service: Gastroenterology;  Laterality: N/A;  . ESOPHAGOGASTRODUODENOSCOPY (EGD) WITH PROPOFOL N/A 07/26/2020   Procedure: ESOPHAGOGASTRODUODENOSCOPY (EGD) WITH PROPOFOL;  Surgeon: Lesly Rubenstein, MD;  Location: [facility] ENDOSCOPY;  Service: Endoscopy;  Laterality: N/A;  . TONSILLECTOMY    . TUBAL LIGATION       There were no vitals filed for this visit.        [facility] PT Assessment - 11/10/20 1307      Assessment   Medical Diagnosis UUI    Referring Provider (PT) Jacqulyn Liner, [place]    Hand Dominance Right    Next MD Visit Not scheduled    Prior Therapy None for this dx      Balance Screen   Has the patient fallen in the past 6 months No             PELVIC HEALTH PHYSICAL THERAPY EVALUATION  SCREENING Red Flags: None Have you had any night sweats? Unexplained weight loss? Saddle anesthesia? Unexplained changes in bowel or bladder habits?  Precautions: Low bone density potential  SUBJECTIVE  Chief Complaint: Patient notes that for about 3 years ago she started to have UI and was prescribed medication which caused severe dry mouth and she discontinued. Patient has had one incident about a year ago where she had an urge while sleeping and woke up after she had voided. Patient denies any stress urinary incontinence. Patient notes that she has UI and increased urge with daily 1 mi walks. Patient states that most UI occurs with prolonged storage of urine and inability to leave a task to empty.  Pertinent History:  Falls Negative.  Scoliosis Negative. Pulmonary disease/dysfunction Negative. Surgical history: see above.  Recent Procedures/Tests/Findings: None  Obstetrical History: G2P2 Deliveries: vaginal Tearing/Episiotomy: G1 tear 2(?) Birthing position: back  Gynecological History: Hysterectomy: Yes Abdominal Endometriosis: Positive Pelvic Organ Prolapse: Negative Last Menstrual Period:  Pain with exam: No Heaviness/pressure: No    Urinary History: Incontinence: Positive. Onset: motherhood, worsened in the past 3 years Triggers: urge, physical activity. Amount: Min.  Protective undergarments: Yes  Type: pantyliners/ UI pads  Number used/day: 2 Fluid Intake: 16-32 oz H20, occasional coffee caffeinated, <1 sodas/day, <1 sweet tea/day Nocturia:  0-1x/night Frequency of urination: every 1-2 hours Toileting posture: feet tucked back Pain with urination: Negative Difficulty initiating urination: Negative Intermittent stream: Positive Frequent UTI: Negative.   Sexual activity/pain: Pain with intercourse: Positive.   Initial penetration: No  Deep thrusting: Yes External stimulation: No Able to achieve orgasm: Yes  Location of pain: inside vaginal canal superior Current pain:  0/10  Max pain:  5-6/10 Least pain:  0/10 Pain quality: pain quality: pressure Radiating pain: No   OBJECTIVE  Mental Status Patient is oriented to person, place and time.  Recent memory is intact.  Remote memory is intact.  Attention span and concentration are intact.  Expressive speech is intact.  Patient's fund of knowledge is within normal limits for educational level.  POSTURE/OBSERVATIONS:  Lumbar lordosis: WNL Thoracic kyphosis: WNL Iliac crest height: appearing equal bilaterally Lumbar lateral shift: appearing negative  GAIT: Generally stiff and hypomobile throughout lumbopelvic region during gait. Gait otherwise unremarkable.   RANGE OF MOTION: deferred 2/2 to time constraints   LEFT RIGHT  Lumbar forward flexion (65):      Lumbar extension (30):     Lumbar lateral flexion (25):     Thoracic and Lumbar rotation (30 degrees):       Hip Flexion (0-125):      Hip IR (0-45):     Hip ER (0-45):     Hip Abduction (0-40):     Hip extension (0-15):       STRENGTH: MMT deferred 2/2 to time constraints  RLE LLE  Hip Flexion    Hip Extension    Hip Abduction     Hip Adduction     Hip ER     Hip IR     Knee Extension    Knee Flexion    Dorsiflexion     Plantarflexion (seated)     ABDOMINAL: deferred 2/2 to time constraints Palpation: Diastasis: Scar mobility: Rib flare:  SPECIAL TESTS: deferred 2/2 to time constraints  PHYSICAL PERFORMANCE MEASURES: STS: WNL  EXTERNAL PELVIC EXAM: deferred 2/2 to time  constraints Palpation: Breath coordination: Cued Lengthen: Cued Contraction: Cough:  INTERNAL VAGINAL EXAM: deferred 2/2 to time constraints Introitus Appears:  Skin integrity:  Scar mobility: Strength (PERF):  Symmetry: Palpation: Prolapse:   INTERNAL RECTAL EXAM: not indicated Strength (PERF): Symmetry: Palpation: Prolapse:   OUTCOME MEASURES: FOTO (70)   ASSESSMENT Patient is a 55 year old presenting to clinic with chief complaints of urinary incontinence. Upon examination, patient demonstrates deficits in PFM coordination, PFM strength, IAP management, posture as evidenced by frequency, urgency, intermittent stream, urge incontinence, pain with penetration. Patient's responses on FOTO outcome measures (70) indicate moderate functional limitations/disability/distress. Patient's progress may be limited due to persistence of symptoms and maladaptive behaviors; however, patient's motivation is advantageous. Patient was able to achieve basic understanding of bladder irritation and PFM function during today's evaluation and responded positively to educational interventions. Patient will benefit from continued skilled therapeutic intervention to address deficits in PFM coordination, PFM strength, IAP  management, posture in order to increase function and improve overall QOL.  EDUCATION Patient educated on prognosis, POC, and provided with HEP including: bladder diary and toileting posture. Patient articulated understanding and returned demonstration. Patient will benefit from further education in order to maximize compliance and understanding for long-term therapeutic gains.  TREATMENT Neuromuscular Re-education: Patient educated on primary functions of the pelvic floor including: posture/balance, sexual pleasure, storage and elimination of waste from the body, abdominal cavity closure, and breath coordination. Patient education on basics of bladder irritation and consequences of  limiting H2O intake as a strategy to mitigate frequency and urgency.    Objective measurements completed on examination: See above findings.                    PT Long Term Goals - 11/11/20 1022      PT LONG TERM GOAL #1   Title Patient will demonstrate independence with HEP in order to maximize therapeutic gains and improve carryover from physical therapy sessions to ADLs in the home and community.    Baseline IE: not demonstrated    Time 12    Period Weeks    Status New    Target Date 02/03/21      PT LONG TERM GOAL #2   Title Patient will demonstrate independent and coordinated diaphragmatic breathing in supine with a 1:2 breathing pattern for improved down-regulation of the nervous system and improved management of intra-abdominal pressures in order to increase function at home and in the community.    Baseline IE: not demonstrated    Time 12    Period Weeks    Status New    Target Date 02/03/21      PT LONG TERM GOAL #3   Title Patient will demonstrate circumferential and sequential contraction of >4/5 MMT, > 6 sec hold x10 and 5 consecutive quick flicks with </= 10 min rest between testing bouts, and relaxation of the PFM coordinated with breath for improved management of intra-abdominal pressure and normal bowel and bladder function without the presence of pain nor incontinence in order to improve participation at home and in the community.    Baseline IE: not demonstrated    Time 12    Period Weeks    Status New    Target Date 02/03/21      PT LONG TERM GOAL #4   Title Patient will demonstrate improved function as evidenced by a score of 75 on FOTO measure for full participation in activities at home and in the community.    Baseline IE: 50    Time 12    Period Weeks    Status New    Target Date 02/03/21                  Plan - 11/10/20 1308    Clinical Impression Statement Patient is a 55 year old presenting to clinic with chief complaints  of urinary incontinence. Upon examination, patient demonstrates deficits in PFM coordination, PFM strength, IAP management, posture as evidenced by frequency, urgency, intermittent stream, urge incontinence, pain with penetration. Patient's responses on FOTO outcome measures (70) indicate moderate functional limitations/disability/distress. Patient's progress may be limited due to persistence of symptoms and maladaptive behaviors; however, patient's motivation is advantageous. Patient was able to achieve basic understanding of bladder irritation and PFM function during today's evaluation and responded positively to educational interventions. Patient will benefit from continued skilled therapeutic intervention to address deficits in PFM coordination, PFM strength, IAP management, posture in  order to increase function and improve overall QOL.    Personal Factors and Comorbidities Age;Behavior Pattern;Comorbidity 3+;Past/Current Experience;Time since onset of injury/illness/exacerbation    Comorbidities adenomyosis, hiatal hernia, Barrett's esophagus, hyperlipidemia, OA, Rheumatoid factor positive    Examination-Activity Limitations Continence;Locomotion Level    Examination-Participation Restrictions Meal Prep;Cleaning;Laundry;Interpersonal Relationship    Stability/Clinical Decision Making Evolving/Moderate complexity    Clinical Decision Making Moderate    Rehab Potential Fair    PT Frequency 1x / week    PT Duration 12 weeks    PT Treatment/Interventions ADLs/Self Care Home Management;Cryotherapy;Electrical Stimulation;Moist Heat;Therapeutic activities;Therapeutic exercise;Neuromuscular re-education;Patient/family education;Manual techniques;Scar mobilization;Taping;Joint Manipulations;Spinal Manipulations    PT Next Visit Plan physical assessment    PT Home Exercise Plan bladder diary    Consulted and Agree with Plan of Care Patient           Patient will benefit from skilled therapeutic  intervention in order to improve the following deficits and impairments:  Postural dysfunction,Improper body mechanics,Decreased strength,Decreased activity tolerance,Decreased endurance,Decreased coordination  Visit Diagnosis: Muscle weakness (generalized)  Other lack of coordination     Problem List There are no problems to display for this patient.  Myles Gip PT, DPT [phone]  11/11/2020, 10:24 AM  [facility] [address] [place], [place], [address] Phone: [phone]   Fax:  [phone]  Name: Tiffany Keller MRN: [id number] Date of Birth: [birth year]

## 2020-11-17 ENCOUNTER — Ambulatory Visit: Attending: Gastroenterology | Admitting: Physical Therapy

## 2020-11-17 ENCOUNTER — Encounter: Payer: Self-pay | Admitting: Physical Therapy

## 2020-11-17 ENCOUNTER — Other Ambulatory Visit: Payer: Self-pay

## 2020-11-17 DIAGNOSIS — R278 Other lack of coordination: Secondary | ICD-10-CM | POA: Diagnosis present

## 2020-11-17 DIAGNOSIS — M6281 Muscle weakness (generalized): Secondary | ICD-10-CM | POA: Diagnosis present

## 2020-11-17 NOTE — Therapy (Signed)
[place] [facility] [address] [place], [place], [address] Phone: [phone]   Fax:  [phone]  Physical Therapy Treatment  Patient Details  Name: BRIZEIDA MCMURRY MRN: [id number] Date of Birth: 22-Dec-1965 Referring Provider (PT): Jacqulyn Liner, [place]   Encounter Date: 11/17/2020   PT End of Session - 11/17/20 1159    Visit Number 2    Number of Visits 12    Date for PT Re-Evaluation 02/03/21    PT Start Time 1148    PT Stop Time 1230    PT Time Calculation (min) 42 min    Activity Tolerance Patient tolerated treatment well    Behavior During Therapy [facility] for tasks assessed/performed           Past Medical History:  Diagnosis Date  . Adenomyosis   . Allergic rhinitis   . Anemia   . Arthritis   . Barrett's esophagus with esophagitis   . CRP elevated   . Dermoid cyst of left ovary   . Endometriosis   . GERD (gastroesophageal reflux disease)   . Hepatitis B antibody positive   . Hiatal hernia   . History of chickenpox   . Hyperlipidemia   . Leiomyoma   . Melanoma ([place]) 1999    Past Surgical History:  Procedure Laterality Date  . ABDOMINAL HYSTERECTOMY    . ABLATION    . BILATERAL SALPINGOOPHORECTOMY    . CERVICAL POLYPECTOMY    . COLONOSCOPY    . COLONOSCOPY N/A 07/26/2020   Procedure: COLONOSCOPY;  Surgeon: Lesly Rubenstein, MD;  Location: [facility] ENDOSCOPY;  Service: Endoscopy;  Laterality: N/A;  . ESOPHAGOGASTRODUODENOSCOPY    . ESOPHAGOGASTRODUODENOSCOPY (EGD) WITH PROPOFOL N/A 08/01/2017   Procedure: ESOPHAGOGASTRODUODENOSCOPY (EGD) WITH PROPOFOL;  Surgeon: Toledo, Benay Pike, MD;  Location: [facility] ENDOSCOPY;  Service: Gastroenterology;  Laterality: N/A;  . ESOPHAGOGASTRODUODENOSCOPY (EGD) WITH PROPOFOL N/A 07/26/2020   Procedure: ESOPHAGOGASTRODUODENOSCOPY (EGD) WITH PROPOFOL;  Surgeon: Lesly Rubenstein, MD;  Location: [facility] ENDOSCOPY;  Service: Endoscopy;  Laterality: N/A;  . TONSILLECTOMY    . TUBAL LIGATION       There were no vitals filed for this visit.   Subjective Assessment - 11/17/20 1152    Subjective Patient presents to clinic with bladder diary completed; no UI over the past week. Patient indicates on bladder diary adequeate emptying without concern for frequency or incomplete/unneccessary emptying. Patient denies any [employer] with allergy related sneezing/coughing.    Currently in Pain? No/denies            TREATMENT  Pre-treatment assessment: RANGE OF MOTION:    LEFT RIGHT  Lumbar forward flexion (65):  WNL    Lumbar extension (30): WNL    Lumbar lateral flexion (25):  WNL WNL  Thoracic and Lumbar rotation (30 degrees):    WNL WNL  Hip Flexion (0-125):   WNL WNL  Hip IR (0-45):  WNL WNL  Hip ER (0-45):  WNL WNL  Hip Abduction (0-40):  WNL WNL  Hip extension (0-15):  WNL WNL    SENSATION: Grossly intact to light touch bilateral LEs as determined by testing dermatomes L2-S2 Proprioception and hot/cold testing deferred on this date  STRENGTH: MMT   RLE LLE  Hip Flexion 5 5  Hip Extension 5 5  Hip Abduction  5 5  Hip Adduction  5 5  Hip ER  5 5  Hip IR  5 5  Knee Extension 5 5  Knee Flexion 5 5  Dorsiflexion  5 5  Plantarflexion (seated) 5 5   ABDOMINAL:  Palpation: no TTP Diastasis: none present Rib flare: none noted  SPECIAL TESTS: FABER (SN 81): R: Negative L: Negative FADIR (SN 94): R: Negative L: Negative Stork/March (SP 93): R: Negative L: Negative  EXTERNAL PELVIC EXAM: Patient educated on the purpose of the pelvic exam and articulated understanding; patient consented to the exam verbally. Palpation: no TTP Breath coordination: present Cued Lengthen: coordinated and palpable Cued Contraction: 3/5 MMT, 3x fast twitch Cough:no appreciable movement of PFM  Neuromuscular Re-education: Supine hooklying diaphragmatic breathing with VCs and TCs for downregulation of the nervous system and improved management of IAP Seated PFM lengthening with inhalation.  VCs and TCs to decrease compensatory patterns and encourage optimal relaxation of the PFM. Supine hooklying, PFM contractions with exhalation. VCs and TCs to decrease compensatory patterns and encourage activation of the PFM. Seated PFM contractions with exhalation. VCs and TCs to decrease compensatory patterns and encourage activation of the PFM.    Patient educated throughout session on appropriate technique and form using multi-modal cueing, HEP, and activity modification. Patient articulated understanding and returned demonstration.  Patient Response to interventions: Comfortable to return in 1 week.  ASSESSMENT Patient presents to clinic with excellent motivation to participate in therapy. Patient demonstrates deficits in PFM coordination, PFM strength, IAP management, posture. Patient able to achieve 3 consecutive fast twitch PFM contractions of moderate strength in gravity eliminated position during today's session and responded positively to active and educational interventions. Patient will benefit from continued skilled therapeutic intervention to address remaining deficits in PFM coordination, PFM strength, IAP management, posture in order to increase function and improve overall QOL.         PT Long Term Goals - 11/11/20 1022      PT LONG TERM GOAL #1   Title Patient will demonstrate independence with HEP in order to maximize therapeutic gains and improve carryover from physical therapy sessions to ADLs in the home and community.    Baseline IE: not demonstrated    Time 12    Period Weeks    Status New    Target Date 02/03/21      PT LONG TERM GOAL #2   Title Patient will demonstrate independent and coordinated diaphragmatic breathing in supine with a 1:2 breathing pattern for improved down-regulation of the nervous system and improved management of intra-abdominal pressures in order to increase function at home and in the community.    Baseline IE: not demonstrated     Time 12    Period Weeks    Status New    Target Date 02/03/21      PT LONG TERM GOAL #3   Title Patient will demonstrate circumferential and sequential contraction of >4/5 MMT, > 6 sec hold x10 and 5 consecutive quick flicks with </= 10 min rest between testing bouts, and relaxation of the PFM coordinated with breath for improved management of intra-abdominal pressure and normal bowel and bladder function without the presence of pain nor incontinence in order to improve participation at home and in the community.    Baseline IE: not demonstrated    Time 12    Period Weeks    Status New    Target Date 02/03/21      PT LONG TERM GOAL #4   Title Patient will demonstrate improved function as evidenced by a score of 75 on FOTO measure for full participation in activities at home and in the community.    Baseline IE: 70  Time 12    Period Weeks    Status New    Target Date 02/03/21                 Plan - 11/17/20 1159    Clinical Impression Statement Patient presents to clinic with excellent motivation to participate in therapy. Patient demonstrates deficits in PFM coordination, PFM strength, IAP management, posture. Patient able to achieve 3 consecutive fast twitch PFM contractions of moderate strength in gravity eliminated position during today's session and responded positively to active and educational interventions. Patient will benefit from continued skilled therapeutic intervention to address remaining deficits in PFM coordination, PFM strength, IAP management, posture in order to increase function and improve overall QOL.    Personal Factors and Comorbidities Age;Behavior Pattern;Comorbidity 3+;Past/Current Experience;Time since onset of injury/illness/exacerbation    Comorbidities adenomyosis, hiatal hernia, Barrett's esophagus, hyperlipidemia, OA, Rheumatoid factor positive    Examination-Activity Limitations Continence;Locomotion Level    Examination-Participation  Restrictions Meal Prep;Cleaning;Laundry;Interpersonal Relationship    Stability/Clinical Decision Making Evolving/Moderate complexity    Rehab Potential Fair    PT Frequency 1x / week    PT Duration 12 weeks    PT Treatment/Interventions ADLs/Self Care Home Management;Cryotherapy;Electrical Stimulation;Moist Heat;Therapeutic activities;Therapeutic exercise;Neuromuscular re-education;Patient/family education;Manual techniques;Scar mobilization;Taping;Joint Manipulations;Spinal Manipulations    PT Next Visit Plan physical assessment    PT Home Exercise Plan bladder diary    Consulted and Agree with Plan of Care Patient           Patient will benefit from skilled therapeutic intervention in order to improve the following deficits and impairments:  Postural dysfunction,Improper body mechanics,Decreased strength,Decreased activity tolerance,Decreased endurance,Decreased coordination  Visit Diagnosis: Muscle weakness (generalized)  Other lack of coordination     Problem List There are no problems to display for this patient.  Myles Gip PT, DPT [phone]  11/17/2020, 12:42 PM  [place] [facility] [address] [place], [place], [address] Phone: [phone]   Fax:  [phone]  Name: YURANI FETTES MRN: [id number] Date of Birth: 05-23-1966

## 2020-11-24 ENCOUNTER — Ambulatory Visit: Admitting: Physical Therapy

## 2020-11-24 ENCOUNTER — Other Ambulatory Visit: Payer: Self-pay

## 2020-11-24 ENCOUNTER — Encounter: Payer: Self-pay | Admitting: Physical Therapy

## 2020-11-24 DIAGNOSIS — M6281 Muscle weakness (generalized): Secondary | ICD-10-CM | POA: Diagnosis not present

## 2020-11-24 DIAGNOSIS — R278 Other lack of coordination: Secondary | ICD-10-CM

## 2020-11-24 NOTE — Therapy (Signed)
[place] [facility] [address] [place], [place], [address] Phone: [phone]   Fax:  [phone]  Physical Therapy Treatment  Patient Details  Name: Tiffany Keller MRN: [id number] Date of Birth: 1966/07/02 Referring Provider (PT): Jacqulyn Liner, [place]   Encounter Date: 11/24/2020   PT End of Session - 11/24/20 1151    Visit Number 3    Number of Visits 12    Date for PT Re-Evaluation 02/03/21    PT Start Time 2025    PT Stop Time 1225    PT Time Calculation (min) 40 min    Activity Tolerance Patient tolerated treatment well    Behavior During Therapy [facility] for tasks assessed/performed           Past Medical History:  Diagnosis Date  . Adenomyosis   . Allergic rhinitis   . Anemia   . Arthritis   . Barrett's esophagus with esophagitis   . CRP elevated   . Dermoid cyst of left ovary   . Endometriosis   . GERD (gastroesophageal reflux disease)   . Hepatitis B antibody positive   . Hiatal hernia   . History of chickenpox   . Hyperlipidemia   . Leiomyoma   . Melanoma ([place]) 1999    Past Surgical History:  Procedure Laterality Date  . ABDOMINAL HYSTERECTOMY    . ABLATION    . BILATERAL SALPINGOOPHORECTOMY    . CERVICAL POLYPECTOMY    . COLONOSCOPY    . COLONOSCOPY N/A 07/26/2020   Procedure: COLONOSCOPY;  Surgeon: Lesly Rubenstein, MD;  Location: [facility] ENDOSCOPY;  Service: Endoscopy;  Laterality: N/A;  . ESOPHAGOGASTRODUODENOSCOPY    . ESOPHAGOGASTRODUODENOSCOPY (EGD) WITH PROPOFOL N/A 08/01/2017   Procedure: ESOPHAGOGASTRODUODENOSCOPY (EGD) WITH PROPOFOL;  Surgeon: Toledo, Benay Pike, MD;  Location: [facility] ENDOSCOPY;  Service: Gastroenterology;  Laterality: N/A;  . ESOPHAGOGASTRODUODENOSCOPY (EGD) WITH PROPOFOL N/A 07/26/2020   Procedure: ESOPHAGOGASTRODUODENOSCOPY (EGD) WITH PROPOFOL;  Surgeon: Lesly Rubenstein, MD;  Location: [facility] ENDOSCOPY;  Service: Endoscopy;  Laterality: N/A;  . TONSILLECTOMY    . TUBAL LIGATION       There were no vitals filed for this visit.   Subjective Assessment - 11/24/20 1147    Subjective Patient notes she was able to purchase a squatty potty and has already noticed a difference with bowel movements. Patient has also used the stool with urination and feels it allows for more complete emptying but still occasionally deals with postvoid UI; patient reports 1x/day.    Currently in Pain? No/denies           TREATMENT  Neuromuscular Re-education: Supine hooklying diaphragmatic breathing with VCs and TCs for downregulation of the nervous system and improved management of IAP Supine hooklying, PFM lengthening with inhalation. VCs and TCs to decrease compensatory patterns and encourage optimal relaxation of the PFM. Supine hooklying, PFM contractions with exhalation. VCs and TCs to decrease compensatory patterns and encourage activation of the PFM. Patient education on strategies for complete emptying of bladder as well as urge suppression techniques to improve PFM coordination and control.     Patient educated throughout session on appropriate technique and form using multi-modal cueing, HEP, and activity modification. Patient articulated understanding and returned demonstration.  Patient Response to interventions: Comfortable to return in 1 week.  ASSESSMENT Patient presents to clinic with excellent motivation to participate in therapy. Patient demonstrates deficits in PFM coordination, PFM strength, IAP management, posture. Patient comfortable to focus on PFM contraction with purposeful lengthening this coming week.  Patient had good form practicing the technique in clinic during today's session and responded positively to active and educational interventions. Patient will benefit from continued skilled therapeutic intervention to address remaining deficits in PFM coordination, PFM strength, IAP management, posture in order to increase function and improve overall QOL.      PT Long Term Goals - 11/11/20 1022      PT LONG TERM GOAL #1   Title Patient will demonstrate independence with HEP in order to maximize therapeutic gains and improve carryover from physical therapy sessions to ADLs in the home and community.    Baseline IE: not demonstrated    Time 12    Period Weeks    Status New    Target Date 02/03/21      PT LONG TERM GOAL #2   Title Patient will demonstrate independent and coordinated diaphragmatic breathing in supine with a 1:2 breathing pattern for improved down-regulation of the nervous system and improved management of intra-abdominal pressures in order to increase function at home and in the community.    Baseline IE: not demonstrated    Time 12    Period Weeks    Status New    Target Date 02/03/21      PT LONG TERM GOAL #3   Title Patient will demonstrate circumferential and sequential contraction of >4/5 MMT, > 6 sec hold x10 and 5 consecutive quick flicks with </= 10 min rest between testing bouts, and relaxation of the PFM coordinated with breath for improved management of intra-abdominal pressure and normal bowel and bladder function without the presence of pain nor incontinence in order to improve participation at home and in the community.    Baseline IE: not demonstrated    Time 12    Period Weeks    Status New    Target Date 02/03/21      PT LONG TERM GOAL #4   Title Patient will demonstrate improved function as evidenced by a score of 75 on FOTO measure for full participation in activities at home and in the community.    Baseline IE: 70    Time 12    Period Weeks    Status New    Target Date 02/03/21                 Plan - 11/24/20 1152    Clinical Impression Statement Patient presents to clinic with excellent motivation to participate in therapy. Patient demonstrates deficits in PFM coordination, PFM strength, IAP management, posture. Patient comfortable to focus on PFM contraction with purposeful lengthening this  coming week. Patient had good form practicing the technique in clinic during today's session and responded positively to active and educational interventions. Patient will benefit from continued skilled therapeutic intervention to address remaining deficits in PFM coordination, PFM strength, IAP management, posture in order to increase function and improve overall QOL.    Personal Factors and Comorbidities Age;Behavior Pattern;Comorbidity 3+;Past/Current Experience;Time since onset of injury/illness/exacerbation    Comorbidities adenomyosis, hiatal hernia, Barrett's esophagus, hyperlipidemia, OA, Rheumatoid factor positive    Examination-Activity Limitations Continence;Locomotion Level    Examination-Participation Restrictions Meal Prep;Cleaning;Laundry;Interpersonal Relationship    Stability/Clinical Decision Making Evolving/Moderate complexity    Rehab Potential Fair    PT Frequency 1x / week    PT Duration 12 weeks    PT Treatment/Interventions ADLs/Self Care Home Management;Cryotherapy;Electrical Stimulation;Moist Heat;Therapeutic activities;Therapeutic exercise;Neuromuscular re-education;Patient/family education;Manual techniques;Scar mobilization;Taping;Joint Manipulations;Spinal Manipulations    PT Home Exercise Plan 3x 4 quick flicks    Consulted and  Agree with Plan of Care Patient           Patient will benefit from skilled therapeutic intervention in order to improve the following deficits and impairments:  Postural dysfunction,Improper body mechanics,Decreased strength,Decreased activity tolerance,Decreased endurance,Decreased coordination  Visit Diagnosis: Muscle weakness (generalized)  Other lack of coordination     Problem List There are no problems to display for this patient.  Myles Gip PT, DPT [phone]  11/24/2020, 1:08 PM  [place] [facility] [address] [place], [place], [address] Phone: [phone]   Fax:   [phone]  Name: Tiffany Keller MRN: [id number] Date of Birth: Dec 30, 1965

## 2020-12-01 ENCOUNTER — Other Ambulatory Visit: Payer: Self-pay

## 2020-12-01 ENCOUNTER — Ambulatory Visit: Admitting: Physical Therapy

## 2020-12-01 ENCOUNTER — Encounter: Payer: Self-pay | Admitting: Physical Therapy

## 2020-12-01 DIAGNOSIS — M6281 Muscle weakness (generalized): Secondary | ICD-10-CM | POA: Diagnosis not present

## 2020-12-01 DIAGNOSIS — R278 Other lack of coordination: Secondary | ICD-10-CM

## 2020-12-01 NOTE — Therapy (Signed)
[facility] [address] [place], [place], [address] Phone: [phone]   Fax:  [phone]  Physical Therapy Treatment  Patient Details  Name: Tiffany Keller MRN: [id number] Date of Birth: 1966/05/04 Referring Provider (PT): Jacqulyn Liner, [place]   Encounter Date: 12/01/2020   PT End of Session - 12/01/20 1154    Visit Number 4    Number of Visits 12    Date for PT Re-Evaluation 02/03/21    PT Start Time 5681    PT Stop Time 1225    PT Time Calculation (min) 40 min    Activity Tolerance Patient tolerated treatment well    Behavior During Therapy [facility] for tasks assessed/performed           Past Medical History:  Diagnosis Date  . Adenomyosis   . Allergic rhinitis   . Anemia   . Arthritis   . Barrett's esophagus with esophagitis   . CRP elevated   . Dermoid cyst of left ovary   . Endometriosis   . GERD (gastroesophageal reflux disease)   . Hepatitis B antibody positive   . Hiatal hernia   . History of chickenpox   . Hyperlipidemia   . Leiomyoma   . Melanoma ([place]) 1999    Past Surgical History:  Procedure Laterality Date  . ABDOMINAL HYSTERECTOMY    . ABLATION    . BILATERAL SALPINGOOPHORECTOMY    . CERVICAL POLYPECTOMY    . COLONOSCOPY    . COLONOSCOPY N/A 07/26/2020   Procedure: COLONOSCOPY;  Surgeon: Lesly Rubenstein, MD;  Location: [facility] ENDOSCOPY;  Service: Endoscopy;  Laterality: N/A;  . ESOPHAGOGASTRODUODENOSCOPY    . ESOPHAGOGASTRODUODENOSCOPY (EGD) WITH PROPOFOL N/A 08/01/2017   Procedure: ESOPHAGOGASTRODUODENOSCOPY (EGD) WITH PROPOFOL;  Surgeon: Toledo, Benay Pike, MD;  Location: [facility] ENDOSCOPY;  Service: Gastroenterology;  Laterality: N/A;  . ESOPHAGOGASTRODUODENOSCOPY (EGD) WITH PROPOFOL N/A 07/26/2020   Procedure: ESOPHAGOGASTRODUODENOSCOPY (EGD) WITH PROPOFOL;  Surgeon: Lesly Rubenstein, MD;  Location: [facility] ENDOSCOPY;  Service: Endoscopy;  Laterality: N/A;  . TONSILLECTOMY    . TUBAL LIGATION       There were no vitals filed for this visit.   Subjective Assessment - 12/01/20 1149    Subjective Patient reports that she has been working on HEP and still finds the relaxation phase requires more thought and focus. Patient notes that she continues to work on relaxing the muscle for complete emptying which is improving albeit slowly. Patient reports only 1 instance with UI since last session; the UI occurred on the way to the toilet. Patient did have 2-3 occurrences of incomplete emptying which resulted in postvoid dribble.    Currently in Pain? No/denies          TREATMENT  Neuromuscular Re-education: Supine hooklying diaphragmatic breathing with VCs and TCs for downregulation of the nervous system and improved management of IAP Supine hooklying, TrA activation with exhalation. VCs and TCs to decrease compensatory patterns and minimize aggravation of the lumbar paraspinals. Supine hooklying, TrA activation with gentle compression. VCs and TCs to decrease compensatory patterns and minimize aggravation of the lumbar paraspinals. Countertop L stretch with TrA activation with coordinated breath for improved postural control Standing Pilates Postural Control, Serve a Tray, RTB, with coordinated breath. VCs and TCs for improved form.   Patient educated throughout session on appropriate technique and form using multi-modal cueing, HEP, and activity modification. Patient articulated understanding and returned demonstration.  Patient Response to interventions: Mild tenderness with gentle compression during TrA  ASSESSMENT  Patient presents to clinic with excellent motivation to participate in therapy. Patient demonstrates deficits in PFM coordination, PFM strength, IAP management, posture. Patient abel to coordinate TrA in supine position with most consistency during today's session and responded positively to active and educational interventions. Patient will benefit from continued skilled  therapeutic intervention to address remaining deficits in PFM coordination, PFM strength, IAP management, posture in order to increase function and improve overall QOL.     PT Long Term Goals - 11/11/20 1022      PT LONG TERM GOAL #1   Title Patient will demonstrate independence with HEP in order to maximize therapeutic gains and improve carryover from physical therapy sessions to ADLs in the home and community.    Baseline IE: not demonstrated    Time 12    Period Weeks    Status New    Target Date 02/03/21      PT LONG TERM GOAL #2   Title Patient will demonstrate independent and coordinated diaphragmatic breathing in supine with a 1:2 breathing pattern for improved down-regulation of the nervous system and improved management of intra-abdominal pressures in order to increase function at home and in the community.    Baseline IE: not demonstrated    Time 12    Period Weeks    Status New    Target Date 02/03/21      PT LONG TERM GOAL #3   Title Patient will demonstrate circumferential and sequential contraction of >4/5 MMT, > 6 sec hold x10 and 5 consecutive quick flicks with </= 10 min rest between testing bouts, and relaxation of the PFM coordinated with breath for improved management of intra-abdominal pressure and normal bowel and bladder function without the presence of pain nor incontinence in order to improve participation at home and in the community.    Baseline IE: not demonstrated    Time 12    Period Weeks    Status New    Target Date 02/03/21      PT LONG TERM GOAL #4   Title Patient will demonstrate improved function as evidenced by a score of 75 on FOTO measure for full participation in activities at home and in the community.    Baseline IE: 70    Time 12    Period Weeks    Status New    Target Date 02/03/21                 Plan - 12/01/20 1154    Clinical Impression Statement Patient presents to clinic with excellent motivation to participate in  therapy. Patient demonstrates deficits in PFM coordination, PFM strength, IAP management, posture. Patient abel to coordinate TrA in supine position with most consistency during today's session and responded positively to active and educational interventions. Patient will benefit from continued skilled therapeutic intervention to address remaining deficits in PFM coordination, PFM strength, IAP management, posture in order to increase function and improve overall QOL.    Personal Factors and Comorbidities Age;Behavior Pattern;Comorbidity 3+;Past/Current Experience;Time since onset of injury/illness/exacerbation    Comorbidities adenomyosis, hiatal hernia, Barrett's esophagus, hyperlipidemia, OA, Rheumatoid factor positive    Examination-Activity Limitations Continence;Locomotion Level    Examination-Participation Restrictions Meal Prep;Cleaning;Laundry;Interpersonal Relationship    Stability/Clinical Decision Making Evolving/Moderate complexity    Rehab Potential Fair    PT Frequency 1x / week    PT Duration 12 weeks    PT Treatment/Interventions ADLs/Self Care Home Management;Cryotherapy;Electrical Stimulation;Moist Heat;Therapeutic activities;Therapeutic exercise;Neuromuscular re-education;Patient/family education;Manual techniques;Scar mobilization;Taping;Joint Manipulations;Spinal Manipulations  PT Home Exercise Plan 3x 4 quick flicks    Consulted and Agree with Plan of Care Patient           Patient will benefit from skilled therapeutic intervention in order to improve the following deficits and impairments:  Postural dysfunction,Improper body mechanics,Decreased strength,Decreased activity tolerance,Decreased endurance,Decreased coordination  Visit Diagnosis: Muscle weakness (generalized)  Other lack of coordination     Problem List There are no problems to display for this patient.  Myles Gip PT, DPT [phone]  12/01/2020, 2:00 PM  [facility] [address] [place], [place], [address] Phone: [phone]   Fax:  [phone]  Name: Tiffany Keller MRN: [id number] Date of Birth: 1966-03-26

## 2020-12-08 ENCOUNTER — Encounter: Payer: Self-pay | Admitting: Physical Therapy

## 2020-12-08 ENCOUNTER — Ambulatory Visit: Admitting: Physical Therapy

## 2020-12-08 ENCOUNTER — Other Ambulatory Visit: Payer: Self-pay

## 2020-12-08 DIAGNOSIS — M6281 Muscle weakness (generalized): Secondary | ICD-10-CM

## 2020-12-08 DIAGNOSIS — R278 Other lack of coordination: Secondary | ICD-10-CM

## 2020-12-08 NOTE — Therapy (Signed)
[facility] [address] [place], [place], [address] Phone: [phone]   Fax:  [phone]  Physical Therapy Treatment  Patient Details  Name: Tiffany Keller MRN: [id number] Date of Birth: 04/27/1966 Referring Provider (PT): Jacqulyn Liner, [place]   Encounter Date: 12/08/2020   PT End of Session - 12/08/20 1152    Visit Number 5    Number of Visits 12    Date for PT Re-Evaluation 02/03/21    PT Start Time 8921    PT Stop Time 1225    PT Time Calculation (min) 40 min    Activity Tolerance Patient tolerated treatment well    Behavior During Therapy [facility] for tasks assessed/performed           Past Medical History:  Diagnosis Date  . Adenomyosis   . Allergic rhinitis   . Anemia   . Arthritis   . Barrett's esophagus with esophagitis   . CRP elevated   . Dermoid cyst of left ovary   . Endometriosis   . GERD (gastroesophageal reflux disease)   . Hepatitis B antibody positive   . Hiatal hernia   . History of chickenpox   . Hyperlipidemia   . Leiomyoma   . Melanoma ([place]) 1999    Past Surgical History:  Procedure Laterality Date  . ABDOMINAL HYSTERECTOMY    . ABLATION    . BILATERAL SALPINGOOPHORECTOMY    . CERVICAL POLYPECTOMY    . COLONOSCOPY    . COLONOSCOPY N/A 07/26/2020   Procedure: COLONOSCOPY;  Surgeon: Lesly Rubenstein, MD;  Location: [facility] ENDOSCOPY;  Service: Endoscopy;  Laterality: N/A;  . ESOPHAGOGASTRODUODENOSCOPY    . ESOPHAGOGASTRODUODENOSCOPY (EGD) WITH PROPOFOL N/A 08/01/2017   Procedure: ESOPHAGOGASTRODUODENOSCOPY (EGD) WITH PROPOFOL;  Surgeon: Toledo, Benay Pike, MD;  Location: [facility] ENDOSCOPY;  Service: Gastroenterology;  Laterality: N/A;  . ESOPHAGOGASTRODUODENOSCOPY (EGD) WITH PROPOFOL N/A 07/26/2020   Procedure: ESOPHAGOGASTRODUODENOSCOPY (EGD) WITH PROPOFOL;  Surgeon: Lesly Rubenstein, MD;  Location: [facility] ENDOSCOPY;  Service: Endoscopy;  Laterality: N/A;  . TONSILLECTOMY    . TUBAL LIGATION       There were no vitals filed for this visit.   Subjective Assessment - 12/08/20 1149    Subjective Patient reports that she continues to have good improvement. She does not have to effort as much to completely empty her bladder. She notes only 25% occurrence of post-void UI. Patient notes that she has not had any leakage with allergy related sneezing/coughing.    Currently in Pain? No/denies           TREATMENT  Neuromuscular Re-education: Patient education on application of urge suppression tactics for improved UUI control.  Reviewed HEP.   Patient educated throughout session on appropriate technique and form using multi-modal cueing, HEP, and activity modification. Patient articulated understanding and returned demonstration.  Patient Response to interventions: Comfortable to return in 2 weeks  ASSESSMENT Patient presents to clinic with excellent motivation to participate in therapy. Patient demonstrates deficits in PFM coordination, PFM strength, IAP management, posture. Patient abel to articulate understanding of diaphragmatic breathing and urge suppression strategies during today's session and responded positively to educational interventions. Patient will benefit from continued skilled therapeutic intervention to address remaining deficits in PFM coordination, PFM strength, IAP management, posture in order to increase function and improve overall QOL.    PT Long Term Goals - 11/11/20 1022      PT LONG TERM GOAL #1   Title Patient will demonstrate independence with HEP in order  to maximize therapeutic gains and improve carryover from physical therapy sessions to ADLs in the home and community.    Baseline IE: not demonstrated    Time 12    Period Weeks    Status New    Target Date 02/03/21      PT LONG TERM GOAL #2   Title Patient will demonstrate independent and coordinated diaphragmatic breathing in supine with a 1:2 breathing pattern for improved down-regulation of the  nervous system and improved management of intra-abdominal pressures in order to increase function at home and in the community.    Baseline IE: not demonstrated    Time 12    Period Weeks    Status New    Target Date 02/03/21      PT LONG TERM GOAL #3   Title Patient will demonstrate circumferential and sequential contraction of >4/5 MMT, > 6 sec hold x10 and 5 consecutive quick flicks with </= 10 min rest between testing bouts, and relaxation of the PFM coordinated with breath for improved management of intra-abdominal pressure and normal bowel and bladder function without the presence of pain nor incontinence in order to improve participation at home and in the community.    Baseline IE: not demonstrated    Time 12    Period Weeks    Status New    Target Date 02/03/21      PT LONG TERM GOAL #4   Title Patient will demonstrate improved function as evidenced by a score of 75 on FOTO measure for full participation in activities at home and in the community.    Baseline IE: 70    Time 12    Period Weeks    Status New    Target Date 02/03/21                 Plan - 12/08/20 1155    Clinical Impression Statement Patient presents to clinic with excellent motivation to participate in therapy. Patient demonstrates deficits in PFM coordination, PFM strength, IAP management, posture. Patient abel to articulate understanding of diaphragmatic breathing and urge suppression strategies during today's session and responded positively to educational interventions. Patient will benefit from continued skilled therapeutic intervention to address remaining deficits in PFM coordination, PFM strength, IAP management, posture in order to increase function and improve overall QOL.    Personal Factors and Comorbidities Age;Behavior Pattern;Comorbidity 3+;Past/Current Experience;Time since onset of injury/illness/exacerbation    Comorbidities adenomyosis, hiatal hernia, Barrett's esophagus, hyperlipidemia,  OA, Rheumatoid factor positive    Examination-Activity Limitations Continence;Locomotion Level    Examination-Participation Restrictions Meal Prep;Cleaning;Laundry;Interpersonal Relationship    Stability/Clinical Decision Making Evolving/Moderate complexity    Rehab Potential Fair    PT Frequency 1x / week    PT Duration 12 weeks    PT Treatment/Interventions ADLs/Self Care Home Management;Cryotherapy;Electrical Stimulation;Moist Heat;Therapeutic activities;Therapeutic exercise;Neuromuscular re-education;Patient/family education;Manual techniques;Scar mobilization;Taping;Joint Manipulations;Spinal Manipulations    PT Home Exercise Plan 3x 4 quick flicks    Consulted and Agree with Plan of Care Patient           Patient will benefit from skilled therapeutic intervention in order to improve the following deficits and impairments:  Postural dysfunction,Improper body mechanics,Decreased strength,Decreased activity tolerance,Decreased endurance,Decreased coordination  Visit Diagnosis: Muscle weakness (generalized)  Other lack of coordination     Problem List There are no problems to display for this patient.  Myles Gip PT, DPT [phone]  12/08/2020, 1:00 PM  [place] [facility] [address] [place], [place],  [address] Phone: [phone]   Fax:  [phone]  Name: Tiffany Keller MRN: [id number] Date of Birth: 04/09/1966

## 2020-12-22 ENCOUNTER — Encounter: Payer: Self-pay | Admitting: Physical Therapy

## 2020-12-22 ENCOUNTER — Ambulatory Visit: Attending: Gastroenterology | Admitting: Physical Therapy

## 2020-12-22 ENCOUNTER — Other Ambulatory Visit: Payer: Self-pay

## 2020-12-22 DIAGNOSIS — M6281 Muscle weakness (generalized): Secondary | ICD-10-CM | POA: Insufficient documentation

## 2020-12-22 DIAGNOSIS — R278 Other lack of coordination: Secondary | ICD-10-CM | POA: Insufficient documentation

## 2020-12-22 NOTE — Therapy (Signed)
[place] [facility] [address] [place], [place], [address] Phone: [phone]   Fax:  [phone]  Physical Therapy Treatment  Patient Details  Name: Tiffany Keller MRN: [id number] Date of Birth: 11-30-1965 Referring Provider (PT): Jacqulyn Liner, [place]   Encounter Date: 12/22/2020   PT End of Session - 12/22/20 1419    Visit Number 6    Number of Visits 12    Date for PT Re-Evaluation 02/03/21    PT Start Time 0355    PT Stop Time 1455    PT Time Calculation (min) 40 min    Activity Tolerance Patient tolerated treatment well    Behavior During Therapy [facility] for tasks assessed/performed           Past Medical History:  Diagnosis Date  . Adenomyosis   . Allergic rhinitis   . Anemia   . Arthritis   . Barrett's esophagus with esophagitis   . CRP elevated   . Dermoid cyst of left ovary   . Endometriosis   . GERD (gastroesophageal reflux disease)   . Hepatitis B antibody positive   . Hiatal hernia   . History of chickenpox   . Hyperlipidemia   . Leiomyoma   . Melanoma ([place]) 1999    Past Surgical History:  Procedure Laterality Date  . ABDOMINAL HYSTERECTOMY    . ABLATION    . BILATERAL SALPINGOOPHORECTOMY    . CERVICAL POLYPECTOMY    . COLONOSCOPY    . COLONOSCOPY N/A 07/26/2020   Procedure: COLONOSCOPY;  Surgeon: Lesly Rubenstein, MD;  Location: [facility] ENDOSCOPY;  Service: Endoscopy;  Laterality: N/A;  . ESOPHAGOGASTRODUODENOSCOPY    . ESOPHAGOGASTRODUODENOSCOPY (EGD) WITH PROPOFOL N/A 08/01/2017   Procedure: ESOPHAGOGASTRODUODENOSCOPY (EGD) WITH PROPOFOL;  Surgeon: Toledo, Benay Pike, MD;  Location: [facility] ENDOSCOPY;  Service: Gastroenterology;  Laterality: N/A;  . ESOPHAGOGASTRODUODENOSCOPY (EGD) WITH PROPOFOL N/A 07/26/2020   Procedure: ESOPHAGOGASTRODUODENOSCOPY (EGD) WITH PROPOFOL;  Surgeon: Lesly Rubenstein, MD;  Location: [facility] ENDOSCOPY;  Service: Endoscopy;  Laterality: N/A;  . TONSILLECTOMY    . TUBAL LIGATION       There were no vitals filed for this visit.   Subjective Assessment - 12/22/20 1420    Subjective Patient notes that she is getting much better; she no longer has to double void or do extra maneuvers to complete emptying. Patient does still have some difficulty with urge suppression and control.    Currently in Pain? No/denies           TREATMENT  Neuromuscular Re-education: Supine hooklying diaphragmatic breathing with VCs and TCs for downregulation of the nervous system and improved management of IAP Supine hooklying, PFM lengthening with inhalation. VCs and TCs to decrease compensatory patterns and encourage optimal relaxation of the PFM. Supine hooklying, PFM contractions with exhalation. VCs and TCs to decrease compensatory patterns and encourage activation of the PFM. Patient education on strategies for timed voiding, decreasing risk of bladder irritation, and progressing PFM coordination/strengthening HEP.    Patient educated throughout session on appropriate technique and form using multi-modal cueing, HEP, and activity modification. Patient articulated understanding and returned demonstration.  Patient Response to interventions: Comfortable to return in 2 weeks  ASSESSMENT Patient presents to clinic with excellent motivation to participate in therapy. Patient demonstrates deficits in PFM coordination, PFM strength, IAP management, posture. Patient with some difficulty releasing PFM contraction with increased reps, but able to pair with eccentric lengthening with good effect during today's session and responded positively to educational interventions. Patient  will benefit from continued skilled therapeutic intervention to address remaining deficits in PFM coordination, PFM strength, IAP management, posture in order to increase function and improve overall QOL.    PT Long Term Goals - 11/11/20 1022      PT LONG TERM GOAL #1   Title Patient will demonstrate independence with  HEP in order to maximize therapeutic gains and improve carryover from physical therapy sessions to ADLs in the home and community.    Baseline IE: not demonstrated    Time 12    Period Weeks    Status New    Target Date 02/03/21      PT LONG TERM GOAL #2   Title Patient will demonstrate independent and coordinated diaphragmatic breathing in supine with a 1:2 breathing pattern for improved down-regulation of the nervous system and improved management of intra-abdominal pressures in order to increase function at home and in the community.    Baseline IE: not demonstrated    Time 12    Period Weeks    Status New    Target Date 02/03/21      PT LONG TERM GOAL #3   Title Patient will demonstrate circumferential and sequential contraction of >4/5 MMT, > 6 sec hold x10 and 5 consecutive quick flicks with </= 10 min rest between testing bouts, and relaxation of the PFM coordinated with breath for improved management of intra-abdominal pressure and normal bowel and bladder function without the presence of pain nor incontinence in order to improve participation at home and in the community.    Baseline IE: not demonstrated    Time 12    Period Weeks    Status New    Target Date 02/03/21      PT LONG TERM GOAL #4   Title Patient will demonstrate improved function as evidenced by a score of 75 on FOTO measure for full participation in activities at home and in the community.    Baseline IE: 70    Time 12    Period Weeks    Status New    Target Date 02/03/21                 Plan - 12/22/20 1419    Clinical Impression Statement Patient presents to clinic with excellent motivation to participate in therapy. Patient demonstrates deficits in PFM coordination, PFM strength, IAP management, posture. Patient with some difficulty releasing PFM contraction with increased reps, but able to pair with eccentric lengthening with good effect during today's session and responded positively to  educational interventions. Patient will benefit from continued skilled therapeutic intervention to address remaining deficits in PFM coordination, PFM strength, IAP management, posture in order to increase function and improve overall QOL.    Personal Factors and Comorbidities Age;Behavior Pattern;Comorbidity 3+;Past/Current Experience;Time since onset of injury/illness/exacerbation    Comorbidities adenomyosis, hiatal hernia, Barrett's esophagus, hyperlipidemia, OA, Rheumatoid factor positive    Examination-Activity Limitations Continence;Locomotion Level    Examination-Participation Restrictions Meal Prep;Cleaning;Laundry;Interpersonal Relationship    Stability/Clinical Decision Making Evolving/Moderate complexity    Rehab Potential Fair    PT Frequency 1x / week    PT Duration 12 weeks    PT Treatment/Interventions ADLs/Self Care Home Management;Cryotherapy;Electrical Stimulation;Moist Heat;Therapeutic activities;Therapeutic exercise;Neuromuscular re-education;Patient/family education;Manual techniques;Scar mobilization;Taping;Joint Manipulations;Spinal Manipulations    PT Home Exercise Plan 5x 3 squeeze-push away    Consulted and Agree with Plan of Care Patient           Patient will benefit from skilled therapeutic intervention in order to  improve the following deficits and impairments:  Postural dysfunction,Improper body mechanics,Decreased strength,Decreased activity tolerance,Decreased endurance,Decreased coordination  Visit Diagnosis: Muscle weakness (generalized)  Other lack of coordination     Problem List There are no problems to display for this patient.  Myles Gip PT, DPT [phone]  12/22/2020, 4:56 PM  [facility] [address] [place], [place], [address] Phone: [phone]   Fax:  [phone]  Name: BURNIS KASER MRN: [id number] Date of Birth: Jun 29, 1966

## 2021-01-05 ENCOUNTER — Ambulatory Visit: Admitting: Physical Therapy

## 2021-01-12 ENCOUNTER — Ambulatory Visit: Attending: Gastroenterology | Admitting: Physical Therapy

## 2021-01-12 ENCOUNTER — Encounter: Payer: Self-pay | Admitting: Physical Therapy

## 2021-01-12 ENCOUNTER — Other Ambulatory Visit: Payer: Self-pay

## 2021-01-12 DIAGNOSIS — R278 Other lack of coordination: Secondary | ICD-10-CM | POA: Diagnosis present

## 2021-01-12 DIAGNOSIS — M6281 Muscle weakness (generalized): Secondary | ICD-10-CM | POA: Diagnosis not present

## 2021-01-12 NOTE — Therapy (Signed)
[place] [facility] [address] [place], [place], [address] Phone: [phone]   Fax:  [phone]  Physical Therapy Treatment  Patient Details  Name: Tiffany Keller MRN: [id number] Date of Birth: Sep 08, 1965 Referring Provider (PT): Jacqulyn Liner, [place]   Encounter Date: 01/12/2021   PT End of Session - 01/12/21 1022    Visit Number 7    Number of Visits 12    Date for PT Re-Evaluation 02/03/21    PT Start Time 2595    PT Stop Time 1055    PT Time Calculation (min) 40 min    Activity Tolerance Patient tolerated treatment well    Behavior During Therapy [facility] for tasks assessed/performed           Past Medical History:  Diagnosis Date  . Adenomyosis   . Allergic rhinitis   . Anemia   . Arthritis   . Barrett's esophagus with esophagitis   . CRP elevated   . Dermoid cyst of left ovary   . Endometriosis   . GERD (gastroesophageal reflux disease)   . Hepatitis B antibody positive   . Hiatal hernia   . History of chickenpox   . Hyperlipidemia   . Leiomyoma   . Melanoma ([place]) 1999    Past Surgical History:  Procedure Laterality Date  . ABDOMINAL HYSTERECTOMY    . ABLATION    . BILATERAL SALPINGOOPHORECTOMY    . CERVICAL POLYPECTOMY    . COLONOSCOPY    . COLONOSCOPY N/A 07/26/2020   Procedure: COLONOSCOPY;  Surgeon: Lesly Rubenstein, MD;  Location: [facility];  Service: Endoscopy;  Laterality: N/A;  . ESOPHAGOGASTRODUODENOSCOPY    . ESOPHAGOGASTRODUODENOSCOPY (EGD) WITH PROPOFOL N/A 08/01/2017   Procedure: ESOPHAGOGASTRODUODENOSCOPY (EGD) WITH PROPOFOL;  Surgeon: Toledo, Benay Pike, MD;  Location: [facility] ENDOSCOPY;  Service: Gastroenterology;  Laterality: N/A;  . ESOPHAGOGASTRODUODENOSCOPY (EGD) WITH PROPOFOL N/A 07/26/2020   Procedure: ESOPHAGOGASTRODUODENOSCOPY (EGD) WITH PROPOFOL;  Surgeon: Lesly Rubenstein, MD;  Location: [facility] ENDOSCOPY;  Service: Endoscopy;  Laterality: N/A;  . TONSILLECTOMY    . TUBAL LIGATION       There were no vitals filed for this visit.   Subjective Assessment - 01/12/21 1019    Subjective Patient reports that she has been doing good and is doing timed voiding every 2 hours. She notes the timed voiding is working really well.    Currently in Pain? No/denies          TREATMENT  Neuromuscular Re-education: Supine hooklying diaphragmatic breathing with VCs and TCs for downregulation of the nervous system and improved management of IAP Supine hooklying, PFM lengthening with inhalation. VCs and TCs to decrease compensatory patterns and encourage optimal relaxation of the PFM. Supine hooklying, PFM contractions with exhalation. VCs and TCs to decrease compensatory patterns and encourage activation of the PFM. Reassessed goals; see below.     Patient educated throughout session on appropriate technique and form using multi-modal cueing, HEP, and activity modification. Patient articulated understanding and returned demonstration.  Patient Response to interventions: Comfortable to return in ~ a month.   ASSESSMENT Patient presents to clinic with excellent motivation to participate in therapy. Patient demonstrates deficits in PFM coordination, PFM strength, IAP management, posture. Patient with greatly improved PFM coordination and strength during today's session and indicating much improved function and progress toward goal achievement. Patient will benefit from continued skilled therapeutic intervention to address remaining deficits in PFM coordination, PFM strength, IAP management, posture in order to increase function and improve overall  QOL.     PT Long Term Goals - 01/12/21 1043      PT LONG TERM GOAL #1   Title Patient will demonstrate independence with HEP in order to maximize therapeutic gains and improve carryover from physical therapy sessions to ADLs in the home and community.    Baseline IE: not demonstrated; 6/1: IND    Time 12    Period Weeks    Status  Achieved    Target Date 02/03/21      PT LONG TERM GOAL #2   Title Patient will demonstrate independent and coordinated diaphragmatic breathing in supine with a 1:2 breathing pattern for improved down-regulation of the nervous system and improved management of intra-abdominal pressures in order to increase function at home and in the community.    Baseline IE: not demonstrated; 6/1: IND    Time 12    Period Weeks    Status Achieved    Target Date 02/03/21      PT LONG TERM GOAL #3   Title Patient will demonstrate circumferential and sequential contraction of >4/5 MMT, > 6 sec hold x10 and 5 consecutive quick flicks with </= 10 min rest between testing bouts, and relaxation of the PFM coordinated with breath for improved management of intra-abdominal pressure and normal bowel and bladder function without the presence of pain nor incontinence in order to improve participation at home and in the community.    Baseline IE: not demonstrated; 6/1: 4/5 MMT, x5 quick flicks, 4 sec hold x3    Time 12    Period Weeks    Status On-going    Target Date 02/03/21      PT LONG TERM GOAL #4   Title Patient will demonstrate improved function as evidenced by a score of 75 on FOTO measure for full participation in activities at home and in the community.    Baseline IE: 37; 6/1: 79    Time 12    Period Weeks    Status Achieved    Target Date 02/03/21                 Plan - 01/12/21 1023    Clinical Impression Statement Patient presents to clinic with excellent motivation to participate in therapy. Patient demonstrates deficits in PFM coordination, PFM strength, IAP management, posture. Patient with greatly improved PFM coordination and strength during today's session and indicating much improved function and progress toward goal achievement. Patient will benefit from continued skilled therapeutic intervention to address remaining deficits in PFM coordination, PFM strength, IAP management, posture  in order to increase function and improve overall QOL.    Personal Factors and Comorbidities Age;Behavior Pattern;Comorbidity 3+;Past/Current Experience;Time since onset of injury/illness/exacerbation    Comorbidities adenomyosis, hiatal hernia, Barrett's esophagus, hyperlipidemia, OA, Rheumatoid factor positive    Examination-Activity Limitations Continence;Locomotion Level    Examination-Participation Restrictions Meal Prep;Cleaning;Laundry;Interpersonal Relationship    Stability/Clinical Decision Making Evolving/Moderate complexity    Rehab Potential Fair    PT Frequency 1x / week    PT Duration 12 weeks    PT Treatment/Interventions ADLs/Self Care Home Management;Cryotherapy;Electrical Stimulation;Moist Heat;Therapeutic activities;Therapeutic exercise;Neuromuscular re-education;Patient/family education;Manual techniques;Scar mobilization;Taping;Joint Manipulations;Spinal Manipulations    PT Home Exercise Plan 5x 3 squeeze-push away    Consulted and Agree with Plan of Care Patient           Patient will benefit from skilled therapeutic intervention in order to improve the following deficits and impairments:  Postural dysfunction,Improper body mechanics,Decreased strength,Decreased activity tolerance,Decreased endurance,Decreased coordination  Visit  Diagnosis: Muscle weakness (generalized)  Other lack of coordination     Problem List There are no problems to display for this patient.  Myles Gip PT, DPT [phone]  01/12/2021, 1:29 PM  [place] [facility] [address] [place], [place], [address] Phone: [phone]   Fax:  [phone]  Name: SHAY JHAVERI MRN: [id number] Date of Birth: July 19, 1966

## 2021-01-19 ENCOUNTER — Encounter: Admitting: Physical Therapy

## 2021-02-02 ENCOUNTER — Encounter: Admitting: Physical Therapy

## 2021-02-09 ENCOUNTER — Encounter: Admitting: Physical Therapy

## 2021-03-07 ENCOUNTER — Ambulatory Visit: Admitting: Physical Therapy

## 2021-08-12 ENCOUNTER — Other Ambulatory Visit: Payer: Self-pay | Admitting: Obstetrics and Gynecology

## 2021-08-12 DIAGNOSIS — Z1231 Encounter for screening mammogram for malignant neoplasm of breast: Secondary | ICD-10-CM

## 2021-10-10 ENCOUNTER — Ambulatory Visit
Admission: RE | Admit: 2021-10-10 | Discharge: 2021-10-10 | Disposition: A | Discharge status: Home / Self Care | Source: Ambulatory Visit | Attending: Obstetrics and Gynecology | Admitting: Obstetrics and Gynecology

## 2021-10-10 ENCOUNTER — Other Ambulatory Visit: Payer: Self-pay

## 2021-10-10 DIAGNOSIS — Z1231 Encounter for screening mammogram for malignant neoplasm of breast: Secondary | ICD-10-CM | POA: Diagnosis not present

## 2022-09-11 ENCOUNTER — Other Ambulatory Visit: Payer: Self-pay | Admitting: Obstetrics and Gynecology

## 2022-09-11 DIAGNOSIS — Z1231 Encounter for screening mammogram for malignant neoplasm of breast: Secondary | ICD-10-CM

## 2022-10-11 ENCOUNTER — Ambulatory Visit
Admission: RE | Admit: 2022-10-11 | Discharge: 2022-10-11 | Disposition: A | Discharge status: Home / Self Care | Source: Ambulatory Visit | Attending: Obstetrics and Gynecology | Admitting: Obstetrics and Gynecology

## 2022-10-11 DIAGNOSIS — Z1231 Encounter for screening mammogram for malignant neoplasm of breast: Secondary | ICD-10-CM | POA: Diagnosis present

## 2022-11-06 IMAGING — MG MM DIGITAL SCREENING BILAT W/ TOMO AND CAD
8 series · 8 of 24 positions shown · non-contrast
Comparison: Previous exam(s).

CLINICAL DATA: Screening.

EXAM:
DIGITAL SCREENING BILATERAL MAMMOGRAM WITH TOMOSYNTHESIS AND CAD
TECHNIQUE: Bilateral screening digital craniocaudal and mediolateral oblique
mammograms were obtained. Bilateral screening digital breast
tomosynthesis was performed. The images were evaluated with
computer-aided detection.

[R CC synth-2D]
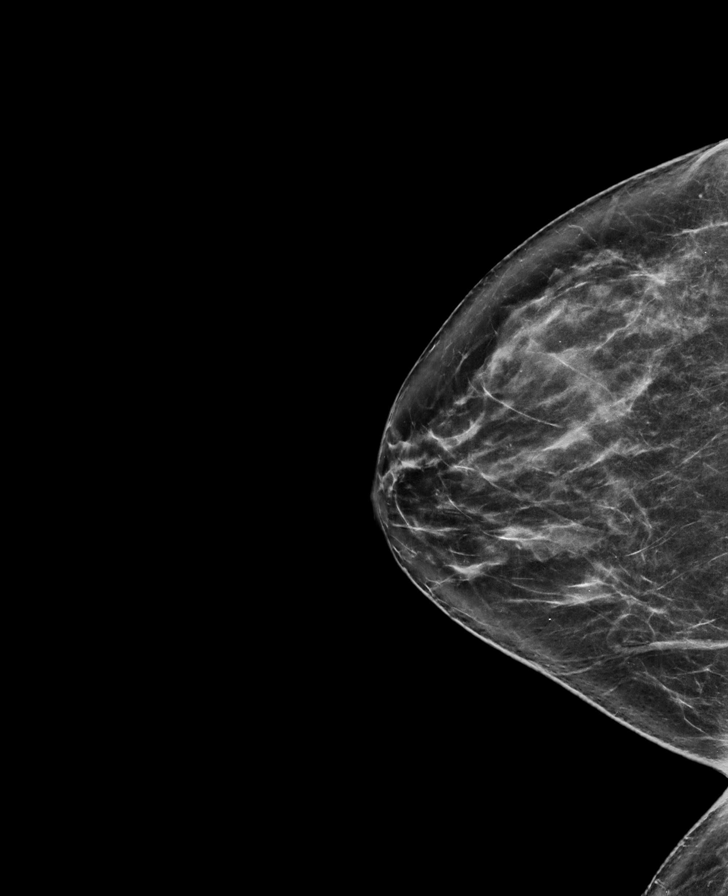

[L MLO synth-2D]
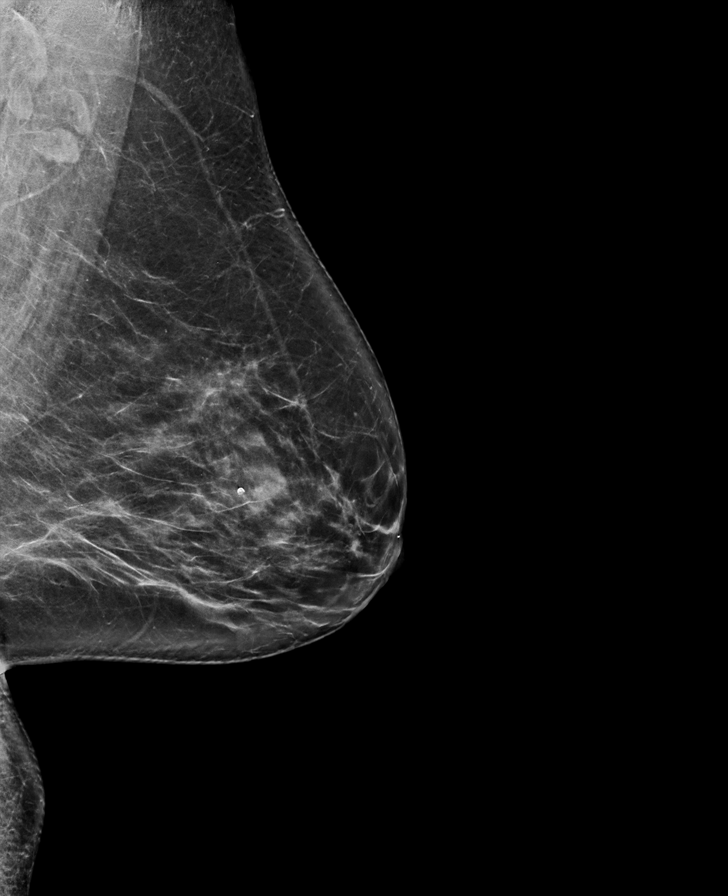

[L CC synth-2D]
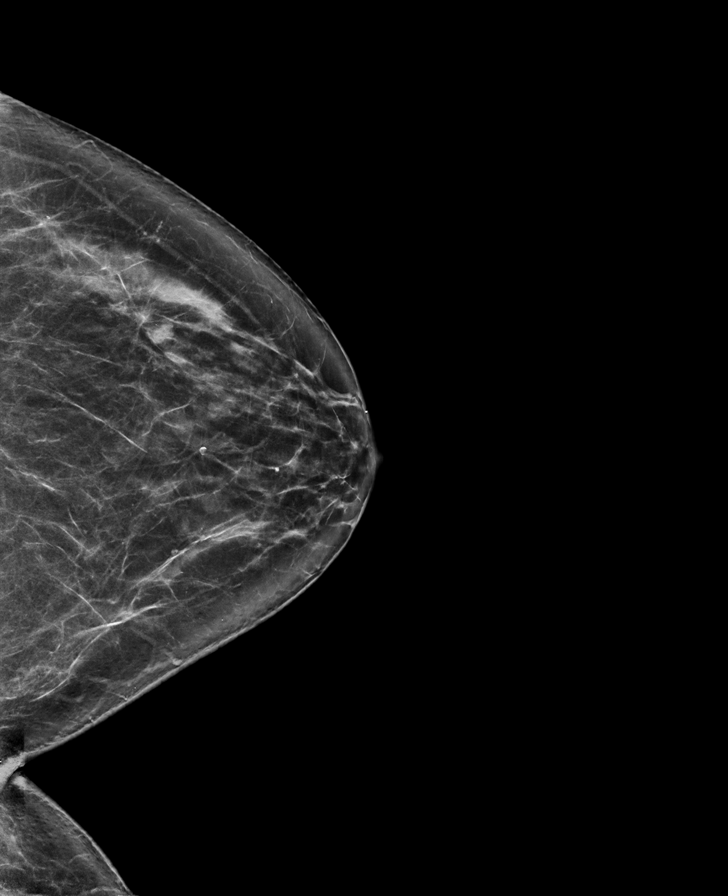

[R MLO synth-2D]
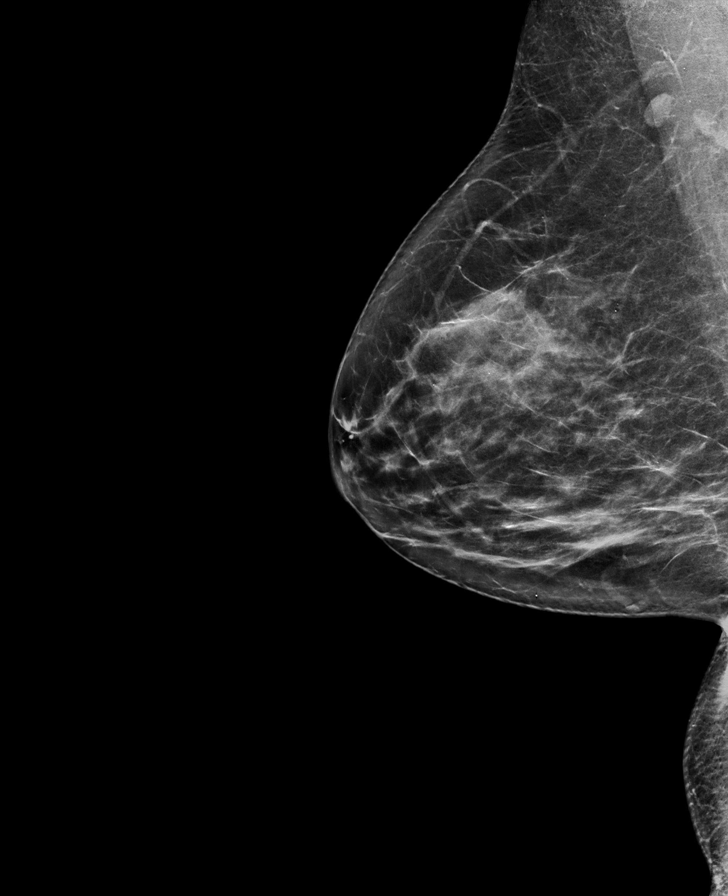

[R CC tomo · tomo slice 38/75.0]
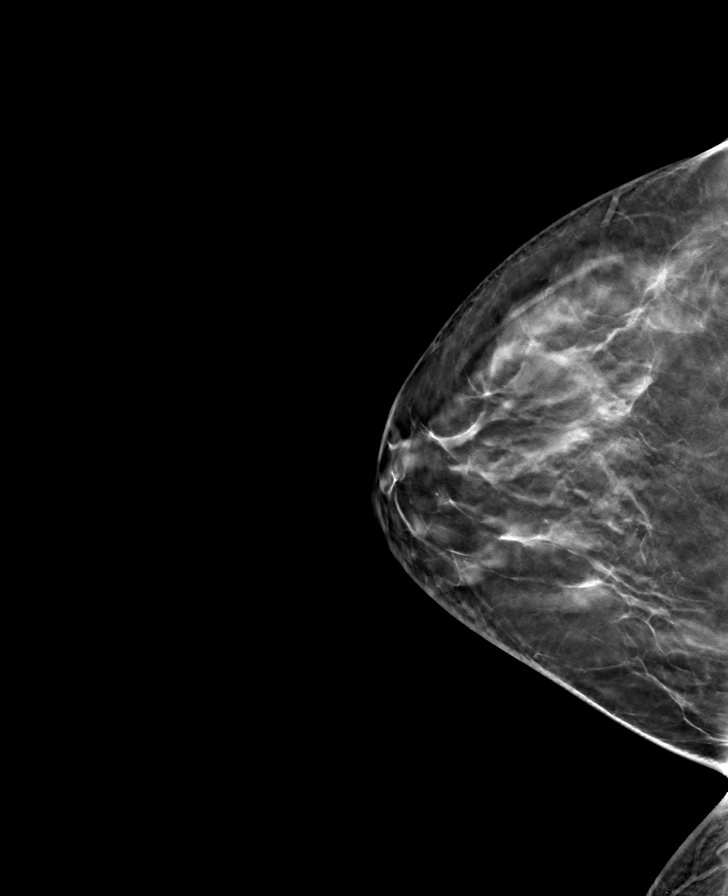

[L MLO tomo · tomo slice 42/83.0]
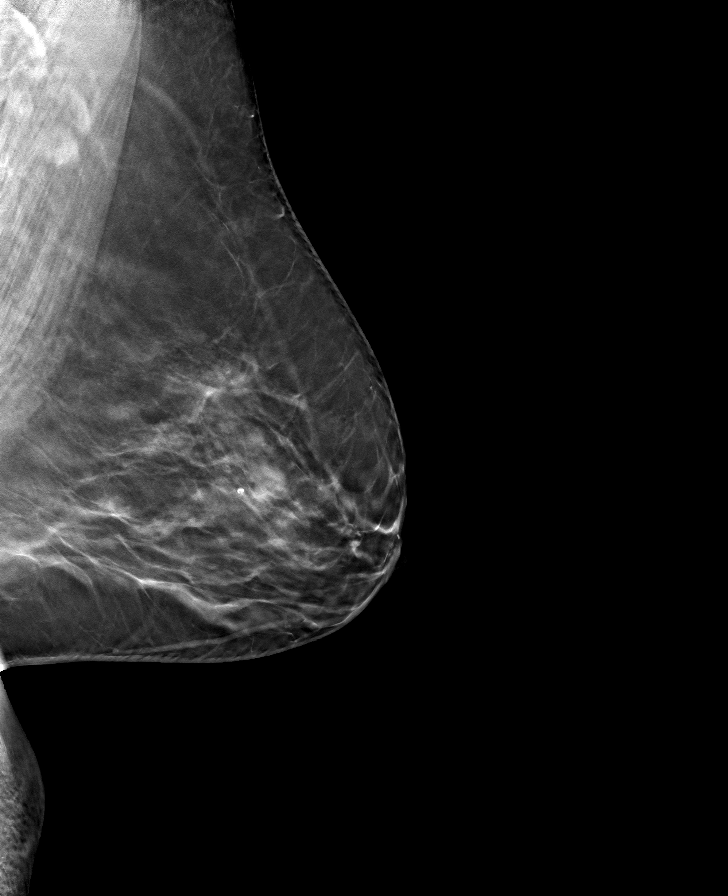

[L CC tomo · tomo slice 39/76.0]
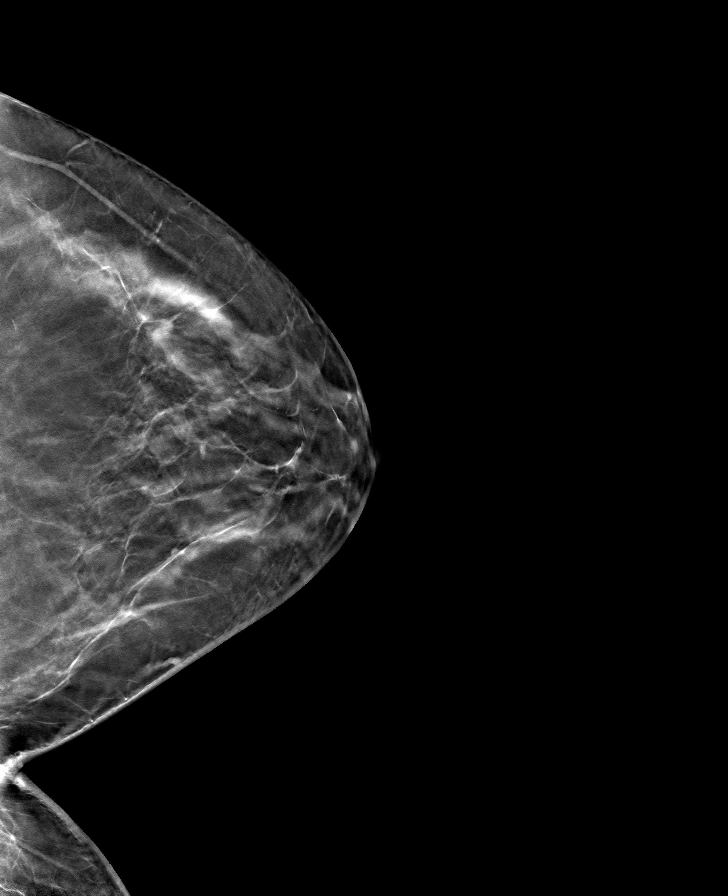

[R MLO tomo · tomo slice 38/75.0]
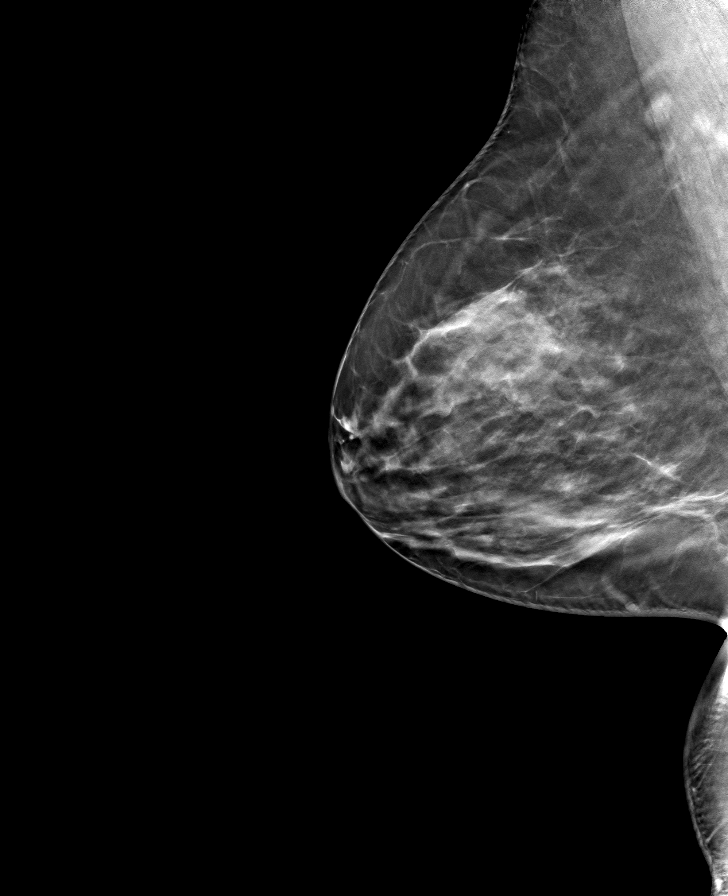

[8 of 24 positions shown; findings below may reference images not displayed]

ACR Breast Density Category c: The breast tissue is heterogeneously
dense, which may obscure small masses.
FINDINGS: In the left breast, a possible mass warrants further evaluation. In
the right breast, no findings suspicious for malignancy.
IMPRESSION: Further evaluation is suggested for a possible mass in the left
breast.

RECOMMENDATION:
Diagnostic mammogram and possibly ultrasound of the left breast.
(Code:C3-P-XXB)

The patient will be contacted regarding the findings, and additional
imaging will be scheduled.

BI-RADS CATEGORY  0: Incomplete. Need additional imaging evaluation
and/or prior mammograms for comparison.

## 2023-09-18 ENCOUNTER — Other Ambulatory Visit: Payer: Self-pay | Admitting: Obstetrics and Gynecology

## 2023-09-18 DIAGNOSIS — Z1231 Encounter for screening mammogram for malignant neoplasm of breast: Secondary | ICD-10-CM

## 2023-10-16 ENCOUNTER — Ambulatory Visit
Admission: RE | Admit: 2023-10-16 | Discharge: 2023-10-16 | Disposition: A | Discharge status: Home / Self Care | Source: Ambulatory Visit | Attending: Obstetrics and Gynecology | Admitting: Obstetrics and Gynecology

## 2023-10-16 DIAGNOSIS — Z1231 Encounter for screening mammogram for malignant neoplasm of breast: Secondary | ICD-10-CM | POA: Insufficient documentation

## 2023-12-06 ENCOUNTER — Encounter: Payer: Self-pay | Admitting: *Deleted

## 2023-12-13 ENCOUNTER — Encounter: Payer: Self-pay | Admitting: *Deleted

## 2023-12-14 ENCOUNTER — Ambulatory Visit: Admitting: Anesthesiology

## 2023-12-14 ENCOUNTER — Encounter
Admission: RE | Disposition: A | Discharge status: Home / Self Care | Payer: Self-pay | Source: Home / Self Care | Attending: Gastroenterology

## 2023-12-14 ENCOUNTER — Encounter: Payer: Self-pay | Admitting: *Deleted

## 2023-12-14 ENCOUNTER — Ambulatory Visit
Admission: RE | Admit: 2023-12-14 | Discharge: 2023-12-14 | Disposition: A | Discharge status: Home / Self Care | Attending: Gastroenterology | Admitting: Gastroenterology

## 2023-12-14 DIAGNOSIS — Z23 Encounter for immunization: Secondary | ICD-10-CM | POA: Diagnosis not present

## 2023-12-14 DIAGNOSIS — K449 Diaphragmatic hernia without obstruction or gangrene: Secondary | ICD-10-CM | POA: Insufficient documentation

## 2023-12-14 DIAGNOSIS — Z79899 Other long term (current) drug therapy: Secondary | ICD-10-CM | POA: Insufficient documentation

## 2023-12-14 DIAGNOSIS — Z791 Long term (current) use of non-steroidal anti-inflammatories (NSAID): Secondary | ICD-10-CM | POA: Diagnosis not present

## 2023-12-14 DIAGNOSIS — K2270 Barrett's esophagus without dysplasia: Secondary | ICD-10-CM | POA: Diagnosis present

## 2023-12-14 DIAGNOSIS — E785 Hyperlipidemia, unspecified: Secondary | ICD-10-CM | POA: Diagnosis not present

## 2023-12-14 HISTORY — DX: Other specified abnormal immunological findings in serum: R76.8

## 2023-12-14 HISTORY — DX: Inflammatory polyarthropathy: M06.4

## 2023-12-14 HISTORY — PX: ESOPHAGOGASTRODUODENOSCOPY (EGD) WITH PROPOFOL: SHX5813

## 2023-12-14 HISTORY — DX: Other specified abnormal immunological findings in serum: R76.89

## 2023-12-14 HISTORY — DX: Epigastric pain: R10.13

## 2023-12-14 HISTORY — DX: Primary osteoarthritis, right hand: M19.041

## 2023-12-14 HISTORY — DX: Unspecified synovitis and tenosynovitis, unspecified site: M65.90

## 2023-12-14 SURGERY — ESOPHAGOGASTRODUODENOSCOPY (EGD) WITH PROPOFOL
Anesthesia: General

## 2023-12-14 MED ORDER — SODIUM CHLORIDE 0.9 % IV SOLN: INTRAVENOUS | Status: DC

## 2023-12-14 MED ORDER — LIDOCAINE HCL (CARDIAC) PF 100 MG/5ML IV SOSY
PREFILLED_SYRINGE | INTRAVENOUS | Status: DC | PRN
  Administered 2023-12-14: 60 mg via INTRAVENOUS

## 2023-12-14 MED ORDER — GLYCOPYRROLATE 0.2 MG/ML IJ SOLN
INTRAMUSCULAR | Status: DC | PRN
  Administered 2023-12-14: .2 mg via INTRAVENOUS

## 2023-12-14 MED ORDER — PROPOFOL 10 MG/ML IV BOLUS
INTRAVENOUS | Status: DC | PRN
  Administered 2023-12-14: 60 mg via INTRAVENOUS

## 2023-12-14 MED ORDER — PROPOFOL 500 MG/50ML IV EMUL
INTRAVENOUS | Status: DC | PRN
  Administered 2023-12-14: 125 ug/kg/min via INTRAVENOUS

## 2023-12-14 NOTE — Anesthesia Preprocedure Evaluation (Signed)
 Anesthesia Evaluation  Patient identified by MRN, date of birth, ID band Patient awake    Reviewed: Allergy & Precautions, NPO status , Patient's Chart, lab work & pertinent test results  History of Anesthesia Complications Negative for: history of anesthetic complications  Airway Mallampati: II  TM Distance: >3 FB Neck ROM: full    Dental no notable dental hx.    Pulmonary neg pulmonary ROS   Pulmonary exam normal        Cardiovascular negative cardio ROS Normal cardiovascular exam     Neuro/Psych negative neurological ROS  negative psych ROS   GI/Hepatic Neg liver ROS, hiatal hernia,GERD  ,,  Endo/Other  negative endocrine ROS    Renal/GU negative Renal ROS  negative genitourinary   Musculoskeletal   Abdominal   Peds  Hematology negative hematology ROS (+)   Anesthesia Other Findings Past Medical History: No date: Adenomyosis No date: Allergic rhinitis No date: Anemia No date: Arthritis No date: Barrett's esophagus with esophagitis No date: CRP elevated No date: Dermoid cyst of left ovary No date: Dyspepsia No date: Endometriosis No date: GERD (gastroesophageal reflux disease) No date: Hepatitis B antibody positive No date: Hiatal hernia No date: History of chickenpox No date: Hyperlipidemia No date: Inflammatory polyarthropathy (HCC) No date: Leiomyoma 1999: Melanoma (HCC) No date: Primary osteoarthritis of both hands No date: Rheumatoid factor positive No date: Synovitis and tenosynovitis  Past Surgical History: No date: ABDOMINAL HYSTERECTOMY No date: ABLATION No date: BILATERAL SALPINGOOPHORECTOMY No date: CERVICAL POLYPECTOMY No date: COLONOSCOPY 07/26/2020: COLONOSCOPY; N/A     Comment:  Procedure: COLONOSCOPY;  Surgeon: Shane Darling,               MD;  Location: [facility] END[facility]COPY;  Service: Endoscopy;                Laterality: N/A; No date: ESOPHAGOGASTRODUODENOSCOPY 08/01/2017:  ESOPHAGOGASTRODUODENOSCOPY (EGD) WITH PROPOFOL ; N/A     Comment:  Procedure: ESOPHAGOGASTRODUODENOSCOPY (EGD) WITH               PROPOFOL ;  Surgeon: Toledo, Alphonsus Jeans, MD;  Location:               [facility];  Service: Gastroenterology;  Laterality:               N/A; 07/26/2020: ESOPHAGOGASTRODUODENOSCOPY (EGD) WITH PROPOFOL ; N/A     Comment:  Procedure: ESOPHAGOGASTRODUODENOSCOPY (EGD) WITH               PROPOFOL ;  Surgeon: Shane Darling, MD;  Location:               [facility];  Service: Endoscopy;  Laterality: N/A; No date: MELANOMA EXCISION; Left     Comment:  SHOULDER No date: TONSILLECTOMY No date: TUBAL LIGATION  BMI    Body Mass Index: 29.72 kg/m      Reproductive/Obstetrics negative OB ROS                             Anesthesia Physical Anesthesia Plan  ASA: 2  Anesthesia Plan: General   Post-op Pain Management: Minimal or no pain anticipated   Induction: Intravenous  PONV Risk Score and Plan: 2 and Propofol  infusion and TIVA  Airway Management Planned: Natural Airway and Nasal Cannula  Additional Equipment:   Intra-op Plan:   Post-operative Plan:   Informed Consent: I have reviewed the patients History and Physical, chart, labs and discussed the procedure including the risks, benefits  and alternatives for the proposed anesthesia with the patient or authorized representative who has indicated his/her understanding and acceptance.     Dental Advisory Given  Plan Discussed with: Anesthesiologist, CRNA and Surgeon  Anesthesia Plan Comments: (Patient consented for risks of anesthesia including but not limited to:  - adverse reactions to medications - risk of airway placement if required - damage to eyes, teeth, lips or other oral mucosa - nerve damage due to positioning  - sore throat or hoarseness - Damage to heart, brain, nerves, lungs, other parts of body or loss of life  Patient voiced understanding and  assent.)       Anesthesia Quick Evaluation

## 2023-12-14 NOTE — Transfer of Care (Signed)
 Immediate Anesthesia Transfer of Care Note  Patient: Tiffany Keller  Procedure(s) Performed: ESOPHAGOGASTRODUODENOSCOPY (EGD) WITH PROPOFOL   Patient Location: PACU and Endoscopy Unit  Anesthesia Type:General  Level of Consciousness: awake, drowsy, and patient cooperative  Airway & Oxygen Therapy: Patient Spontanous Breathing  Post-op Assessment: Report given to RN and Post -op Vital signs reviewed and stable  Post vital signs: Reviewed and stable  Last Vitals:  Vitals Value Taken Time  BP 109/65 12/14/23 1143  Temp 35.8 C 12/14/23 1143  Pulse 77 12/14/23 1143  Resp 13 12/14/23 1143  SpO2 97 % 12/14/23 1143    Last Pain:  Vitals:   12/14/23 1143  TempSrc: Temporal  PainSc: Asleep         Complications: No notable events documented.

## 2023-12-14 NOTE — Interval H&P Note (Signed)
 History and Physical Interval Note:  12/14/2023 11:31 AM  Tiffany Keller  has presented today for surgery, with the diagnosis of dyspepsia, barrett's esophagus.  The various methods of treatment have been discussed with the patient and family. After consideration of risks, benefits and other options for treatment, the patient has consented to  Procedure(s): ESOPHAGOGASTRODUODENOSCOPY (EGD) WITH PROPOFOL  (N/A) as a surgical intervention.  The patient's history has been reviewed, patient examined, no change in status, stable for surgery.  I have reviewed the patient's chart and labs.  Questions were answered to the patient's satisfaction.     Shane Darling  Ok to proceed with EGD

## 2023-12-14 NOTE — Op Note (Addendum)
 [facility] Gastroenterology Patient Name: Tiffany Keller Procedure Date: 12/14/2023 11:28 AM MRN: [id number] Account #: [id number] Date of Birth: Oct 03, 1965 Admit Type: Outpatient Age: 58 Room: [facility] ENDO ROOM 3 Gender: Female Note Status: Supervisor Override Instrument Name: Upper Endoscope [id number] Procedure:             Upper GI endoscopy Indications:           Dyspepsia, Barrett's esophagus Providers:             Leida Puna MD, MD Referring MD:          Lavelle Posey. Vila Grayer, MD (Referring MD) Medicines:             Monitored Anesthesia Care Complications:         No immediate complications. Estimated blood loss:                         Minimal. Procedure:             Pre-Anesthesia Assessment:                        - Prior to the procedure, a History and Physical was                         performed, and patient medications and allergies were                         reviewed. The patient is competent. The risks and                         benefits of the procedure and the sedation options and                         risks were discussed with the patient. All questions                         were answered and informed consent was obtained.                         Patient identification and proposed procedure were                         verified by the physician, the nurse, the                         anesthesiologist, the anesthetist and the technician                         in the endoscopy suite. Mental Status Examination:                         alert and oriented. Airway Examination: normal                         oropharyngeal airway and neck mobility. Respiratory                         Examination: clear to auscultation. CV Examination:  normal. Prophylactic Antibiotics: The patient does not                         require prophylactic antibiotics. Prior                         Anticoagulants: The patient has taken no  anticoagulant                         or antiplatelet agents. ASA Grade Assessment: II - A                         patient with mild systemic disease. After reviewing                         the risks and benefits, the patient was deemed in                         satisfactory condition to undergo the procedure. The                         anesthesia plan was to use monitored anesthesia care                         (MAC). Immediately prior to administration of                         medications, the patient was re-assessed for adequacy                         to receive sedatives. The heart rate, respiratory                         rate, oxygen saturations, blood pressure, adequacy of                         pulmonary ventilation, and response to care were                         monitored throughout the procedure. The physical                         status of the patient was re-assessed after the                         procedure.                        After obtaining informed consent, the endoscope was                         passed under direct vision. Throughout the procedure,                         the patient's blood pressure, pulse, and oxygen                         saturations were monitored continuously. The Endoscope  was introduced through the mouth, and advanced to the                         second part of duodenum. The upper GI endoscopy was                         accomplished without difficulty. The patient tolerated                         the procedure well. Findings:      There were esophageal mucosal changes classified as Barrett's stage       C1-M1 per Prague criteria present in the lower third of the esophagus.       The maximum longitudinal extent of these mucosal changes was 1 cm in       length. Mucosa was biopsied with a cold forceps for histology in 4       quadrants in the lower third of the esophagus. One specimen bottle was        sent to pathology. Estimated blood loss was minimal.      A small hiatal hernia was present.      The entire examined stomach was normal.      The examined duodenum was normal. Impression:            - Esophageal mucosal changes classified as Barrett's                         stage C1-M1 per Prague criteria. Biopsied.                        - Small hiatal hernia.                        - Normal stomach.                        - Normal examined duodenum. Recommendation:        - Discharge patient to home.                        - Resume previous diet.                        - Continue present medications.                        - Await pathology results.                        - Return to referring physician as previously                         scheduled. Procedure Code(s):     --- Professional ---                        [phone], Esophagogastroduodenoscopy, flexible,                         transoral; with biopsy, single or multiple Diagnosis Code(s):     --- Professional ---  K22.70, Barrett's esophagus without dysplasia                        K44.9, Diaphragmatic hernia without obstruction or                         gangrene CPT copyright 2022 American Medical Association. All rights reserved. The codes documented in this report are preliminary and upon coder review may  be revised to meet current compliance requirements. Leida Puna MD, MD 12/14/2023 11:46:17 AM Number of Addenda: 0 Note Initiated On: 12/14/2023 11:28 AM Estimated Blood Loss:  Estimated blood loss was minimal.      [facility]

## 2023-12-14 NOTE — Anesthesia Postprocedure Evaluation (Signed)
 Anesthesia Post Note  Patient: Beebe Counce Vanetten  Procedure(s) Performed: ESOPHAGOGASTRODUODENOSCOPY (EGD) WITH PROPOFOL   Patient location during evaluation: PACU Anesthesia Type: General Level of consciousness: awake and alert Pain management: pain level controlled Vital Signs Assessment: post-procedure vital signs reviewed and stable Respiratory status: spontaneous breathing, nonlabored ventilation and respiratory function stable Cardiovascular status: blood pressure returned to baseline and stable Postop Assessment: no apparent nausea or vomiting Anesthetic complications: no   No notable events documented.   Last Vitals:  Vitals:   12/14/23 1030 12/14/23 1143  BP: 121/71 109/65  Pulse: 71 77  Resp: 16 13  Temp: (!) 35.8 C (!) 35.8 C  SpO2: 99% 97%    Last Pain:  Vitals:   12/14/23 1143  TempSrc: Temporal  PainSc: Asleep                 Baltazar Bonier

## 2023-12-14 NOTE — Anesthesia Procedure Notes (Signed)
 Procedure Name: MAC Date/Time: 12/14/2023 11:30 AM  Performed by: Orin Birk, CRNAPre-anesthesia Checklist: Patient identified, Emergency Drugs available, Suction available and Patient being monitored Patient Re-evaluated:Patient Re-evaluated prior to induction Oxygen Delivery Method: Simple face mask

## 2023-12-14 NOTE — H&P (Signed)
 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 No blood thinners. No family history of GI malignancies. No neck surgeries.    Current Facility-Administered Medications:    0.9 %  sodium chloride  infusion, , Intravenous, Continuous, Shivon Hackel, Leanora Prophet, MD, Last Rate: 20 mL/hr at 12/14/23 1123, Continued from Pre-op at 12/14/23 1123  Facility-Administered Medications Ordered in Other Encounters:    glycopyrrolate  (ROBINUL ) injection, , Intravenous, Anesthesia Intra-op, Fletcher-Harrison, Tawana, CRNA, 0.2 mg at 12/14/23 1121  Medications Prior to Admission  Medication Sig Dispense Refill Last Dose/Taking   pantoprazole (PROTONIX) 40 MG tablet Take 40 mg by mouth daily.   12/13/2023   estradiol (CLIMARA - DOSED IN MG/24 HR) 0.1 mg/24hr patch Place 0.1 mg onto the skin once a week.      Influenza Virus Vacc Split PF (FLUARIX IM) Inject into the muscle.      Influenza Virus Vaccine Split (AFLURIA IM) Inject into the muscle.      meloxicam (MOBIC) 7.5 MG tablet Take 7.5 mg by mouth daily. (Patient not taking: Reported on 12/14/2023)   Not Taking   montelukast (SINGULAIR) 10 MG tablet Take 10 mg by mouth at bedtime. (Patient not taking: Reported on 11/10/2020)      ranitidine (ZANTAC) 150 MG tablet Take 150 mg by mouth 2 (two) times daily.      tolterodine (DETROL) 2 MG tablet Take 2 mg by mouth 2 (two) times daily. (Patient not taking: Reported on 11/10/2020)        Allergies  Allergen Reactions   Other Rash    ESTRADIOL PATCH     Past Medical History:  Diagnosis Date   Adenomyosis    Allergic rhinitis    Anemia    Arthritis    Barrett's esophagus with esophagitis    CRP elevated    Dermoid cyst of left ovary    Dyspepsia    Endometriosis    GERD (gastroesophageal reflux disease)     Hepatitis B antibody positive    Hiatal hernia    History of chickenpox    Hyperlipidemia    Inflammatory polyarthropathy (HCC)    Leiomyoma    Melanoma (HCC) 1999   Primary osteoarthritis of both hands    Rheumatoid factor positive    Synovitis and tenosynovitis     Review of systems:  Otherwise negative.    Physical Exam  Gen: Alert, oriented. Appears stated age.  HEENT: PERRLA. Lungs: No respiratory distress CV: RRR Abd: soft, benign, no masses Ext: No edema    Planned procedures: Proceed with EGD. The patient understands the nature of the planned procedure, indications, risks, alternatives and potential complications including but not limited to bleeding, infection, perforation, damage to internal organs and possible oversedation/side effects from anesthesia. The patient agrees and gives consent to proceed.  Please refer to procedure notes for findings, recommendations and patient disposition/instructions.     Shane Darling, MD [facility]

## 2023-12-17 LAB — SURGICAL PATHOLOGY
